# Patient Record
Sex: Male | Born: 1965
Health system: Southern US, Community
[De-identification: ages and names within clinical notes are randomized; demographics above are authoritative.]

## PROBLEM LIST (undated history)

## (undated) DIAGNOSIS — E785 Hyperlipidemia, unspecified: Secondary | ICD-10-CM

---

## 2005-11-06 ENCOUNTER — Ambulatory Visit (HOSPITAL_COMMUNITY): Admission: RE | Admit: 2005-11-06 | Discharge: 2005-11-06 | Payer: Self-pay | Admitting: Orthopedic Surgery

## 2011-03-17 ENCOUNTER — Emergency Department (HOSPITAL_COMMUNITY): Payer: Commercial Managed Care - PPO

## 2011-03-17 ENCOUNTER — Other Ambulatory Visit: Payer: Self-pay

## 2011-03-17 ENCOUNTER — Emergency Department (HOSPITAL_COMMUNITY)
Admission: EM | Admit: 2011-03-17 | Discharge: 2011-03-17 | Disposition: A | Discharge status: Home / Self Care | Payer: Commercial Managed Care - PPO | Attending: Emergency Medicine | Admitting: Emergency Medicine

## 2011-03-17 ENCOUNTER — Encounter (HOSPITAL_COMMUNITY): Payer: Self-pay | Admitting: *Deleted

## 2011-03-17 DIAGNOSIS — E7800 Pure hypercholesterolemia, unspecified: Secondary | ICD-10-CM | POA: Insufficient documentation

## 2011-03-17 DIAGNOSIS — J40 Bronchitis, not specified as acute or chronic: Secondary | ICD-10-CM | POA: Insufficient documentation

## 2011-03-17 DIAGNOSIS — R079 Chest pain, unspecified: Secondary | ICD-10-CM | POA: Insufficient documentation

## 2011-03-17 DIAGNOSIS — Z8249 Family history of ischemic heart disease and other diseases of the circulatory system: Secondary | ICD-10-CM | POA: Insufficient documentation

## 2011-03-17 DIAGNOSIS — I10 Essential (primary) hypertension: Secondary | ICD-10-CM | POA: Insufficient documentation

## 2011-03-17 DIAGNOSIS — Z79899 Other long term (current) drug therapy: Secondary | ICD-10-CM | POA: Insufficient documentation

## 2011-03-17 DIAGNOSIS — K219 Gastro-esophageal reflux disease without esophagitis: Secondary | ICD-10-CM | POA: Insufficient documentation

## 2011-03-17 LAB — CBC
Hemoglobin: 15.9 g/dL (ref 13.0–17.0)
MCH: 30.3 pg (ref 26.0–34.0)
MCHC: 32.9 g/dL (ref 30.0–36.0)
RDW: 16.3 % — ABNORMAL HIGH (ref 11.5–15.5)

## 2011-03-17 LAB — POCT I-STAT TROPONIN I: Troponin i, poc: 0 ng/mL (ref 0.00–0.08)

## 2011-03-17 LAB — BASIC METABOLIC PANEL
BUN: 11 mg/dL (ref 6–23)
Calcium: 9.7 mg/dL (ref 8.4–10.5)
Creatinine, Ser: 1.07 mg/dL (ref 0.50–1.35)
GFR calc non Af Amer: 82 mL/min — ABNORMAL LOW (ref >90)
Glucose, Bld: 85 mg/dL (ref 70–99)
Potassium: 4 mEq/L (ref 3.5–5.1)

## 2011-03-17 MED ORDER — ALBUTEROL SULFATE (5 MG/ML) 0.5% IN NEBU
5.0000 mg | INHALATION_SOLUTION | Freq: Once | RESPIRATORY_TRACT | Status: AC
  Administered 2011-03-17: 5 mg via RESPIRATORY_TRACT
  Filled 2011-03-17: qty 1

## 2011-03-17 MED ORDER — ALBUTEROL SULFATE HFA 108 (90 BASE) MCG/ACT IN AERS
1.0000 | INHALATION_SPRAY | Freq: Once | RESPIRATORY_TRACT | Status: AC
  Administered 2011-03-17: 1 via RESPIRATORY_TRACT
  Filled 2011-03-17: qty 6.7

## 2011-03-17 MED ORDER — GI COCKTAIL ~~LOC~~
30.0000 mL | Freq: Once | ORAL | Status: AC
  Administered 2011-03-17: 30 mL via ORAL
  Filled 2011-03-17: qty 30

## 2011-03-17 MED ORDER — PREDNISONE 10 MG PO TABS: 10.0000 mg | ORAL_TABLET | Freq: Two times a day (BID) | ORAL | Status: DC

## 2011-03-17 MED ORDER — IPRATROPIUM BROMIDE 0.02 % IN SOLN
0.5000 mg | Freq: Once | RESPIRATORY_TRACT | Status: AC
  Administered 2011-03-17: 0.5 mg via RESPIRATORY_TRACT
  Filled 2011-03-17: qty 2.5

## 2011-03-17 MED ORDER — FAMOTIDINE IN NACL 20-0.9 MG/50ML-% IV SOLN
20.0000 mg | Freq: Once | INTRAVENOUS | Status: AC
  Administered 2011-03-17: 20 mg via INTRAVENOUS
  Filled 2011-03-17: qty 50

## 2011-03-17 MED ORDER — ASPIRIN 81 MG PO CHEW
324.0000 mg | CHEWABLE_TABLET | Freq: Once | ORAL | Status: AC
  Administered 2011-03-17: 324 mg via ORAL
  Filled 2011-03-17: qty 4

## 2011-03-17 NOTE — ED Notes (Signed)
RT called for breathing tx. 

## 2011-03-17 NOTE — ED Notes (Signed)
PT does not know stress test result and appears anxious; reporting sign cardiac hx with parents.

## 2011-03-17 NOTE — Discharge Instructions (Signed)
Use albuterol inhaler, 2 puffs every 4 hours, as needed for cough and shortness of breath.  Take prednisone as prescribed.   Follow up with your family doctor.  You should return to the ER if your chest pain or shortness of breath worsens. OlC

## 2011-03-17 NOTE — ED Notes (Signed)
Patient c/o chest tightness onset last pm states he took new medication last pm c/o slight sob. States he had a stress test earlier in the week.

## 2011-03-17 NOTE — ED Provider Notes (Signed)
History     CSN: [id number]  Arrival date & time 03/17/11  4010   First MD Initiated Contact with Patient 03/17/11 0602      Chief Complaint  Patient presents with  . Chest Pain    (Consider location/radiation/quality/duration/timing/severity/associated sxs/prior treatment) HPI History provided by pt.   Pt has had constant, non-radiating, substernal chest tightness since midnight last night.  Associated w/ chest tingling and SOB.  Non-exertional.  Has had a mild cough recently as well as chills. No recent trauma/heavy lifting.  H/o acid reflux but this pain feels different.  Has been taking omeprazole for the past few days.  H/o HTN and hypercholesterolemia.  Strong FH MI.  Had a stress test at [facility] 3 days ago and is unsure of results.  No RF for PE and denies LE pain/edema.   History reviewed. No pertinent past medical history.  History reviewed. No pertinent past surgical history.  History reviewed. No pertinent family history.  History  Substance Use Topics  . Smoking status: Never Smoker   . Smokeless tobacco: Not on file  . Alcohol Use: No      Review of Systems  All other systems reviewed and are negative.    Allergies  Codeine  Home Medications   Current Outpatient Rx  Name Route Sig Dispense Refill  . ATORVASTATIN CALCIUM 40 MG PO TABS Oral Take 40 mg by mouth daily.    Marland Kitchen EZETIMIBE 10 MG PO TABS Oral Take 10 mg by mouth daily.    Marland Kitchen OMEPRAZOLE 40 MG PO CPDR Oral Take 40 mg by mouth daily.      BP 154/83  Pulse 71  Temp(Src) 97.6 F (36.4 C) (Oral)  Resp 18  SpO2 97%  Physical Exam  Nursing note and vitals reviewed. Constitutional: He is oriented to person, place, and time. He appears well-developed and well-nourished. No distress.  HENT:  Head: Normocephalic and atraumatic.  Eyes:       Normal appearance  Neck: Normal range of motion.  Cardiovascular: Normal rate, regular rhythm and intact distal pulses.   Pulmonary/Chest:  Effort normal and breath sounds normal. No respiratory distress. He exhibits no tenderness.       Pt asks to have head of bed elevated because he thinks laying flat is aggravating his pain.   Abdominal: Soft. Bowel sounds are normal. He exhibits no distension. There is no tenderness. There is no guarding.  Musculoskeletal:       No peripheral edema or calf tenderness  Neurological: He is alert and oriented to person, place, and time.  Skin: Skin is warm and dry. No rash noted.  Psychiatric: He has a normal mood and affect. His behavior is normal.       Anxious appearing    ED Course  Procedures (including critical care time)   Date: 03/17/2011  Rate: 68  Rhythm: normal sinus rhythm  QRS Axis: normal  Intervals: normal  ST/T Wave abnormalities: normal  Conduction Disutrbances: none  Narrative Interpretation:   Old EKG Reviewed: none  Labs Reviewed  BASIC METABOLIC PANEL - Abnormal; Notable for the following:    GFR calc non Af Amer 82 (*)    All other components within normal limits  CBC - Abnormal; Notable for the following:    RDW 16.3 (*)    All other components within normal limits  POCT I-STAT TROPONIN I   Dg Chest 2 View  03/17/2011  *RADIOLOGY REPORT*  Clinical Data: Chest tightness  CHEST -  2 VIEW  Comparison: 09/23/2007  Findings: Normal heart size and pulmonary vascularity for technique.  Peribronchial thickening and perihilar interstitial changes suggesting chronic bronchitis versus reactive airways disease.  Mild hyperinflation. No focal airspace consolidation in the lungs.  No blunting of costophrenic angles.  No pneumothorax. No significant change since previous study.  IMPRESSION: Hyperinflation with peribronchial thickening suggesting chronic bronchitis or reactive airways disease.  No focal consolidation.  Original Report Authenticated By: Marlon Pel, M.D.     1. Bronchitis       MDM  46yo M w/ h/o HTN and hyperlipidemia and strong FH of MI presents  w/ atypical CP.  Had a stress test at [facility] on Tuesday.  Results reviewed and are normal.  No acute findings on exam.  EKG non-ischemic.  Suspect acid reflux.  Aspirin as well as GI cocktail and IV pepcid ordered.  CXR and troponin pending.  6:20 AM   CXR shows bronchitis.  Troponin neg.  Pt reports mild relief w/ GI cocktail.  Will try a breathing treatment and reassess shortly.  7:40 AM   Pt reports that his tightness has improved.  Lung sounds clear and no coughing on re-examination.  D/c'd home w/ an albuterol inhaler and prescription for prednisone.  Advised him to f/u with PCP and return precautions discussed.       Otilio Miu, [place] 03/17/11 [phone]

## 2011-03-18 NOTE — ED Provider Notes (Signed)
Medical screening examination/treatment/procedure(s) were performed by non-physician practitioner and as supervising physician I was immediately available for consultation/collaboration.   Joya Gaskins, MD 03/18/11 [phone]

## 2015-01-31 HISTORY — PX: VENTRAL HERNIA REPAIR: SHX424

## 2017-03-02 DIAGNOSIS — E1165 Type 2 diabetes mellitus with hyperglycemia: Secondary | ICD-10-CM | POA: Diagnosis not present

## 2017-03-16 DIAGNOSIS — E1165 Type 2 diabetes mellitus with hyperglycemia: Secondary | ICD-10-CM | POA: Diagnosis not present

## 2017-03-16 DIAGNOSIS — E291 Testicular hypofunction: Secondary | ICD-10-CM | POA: Diagnosis not present

## 2017-03-16 DIAGNOSIS — E039 Hypothyroidism, unspecified: Secondary | ICD-10-CM | POA: Diagnosis not present

## 2017-03-27 DIAGNOSIS — E039 Hypothyroidism, unspecified: Secondary | ICD-10-CM | POA: Diagnosis not present

## 2017-03-30 DIAGNOSIS — J0120 Acute ethmoidal sinusitis, unspecified: Secondary | ICD-10-CM | POA: Diagnosis not present

## 2017-05-14 DIAGNOSIS — N419 Inflammatory disease of prostate, unspecified: Secondary | ICD-10-CM | POA: Diagnosis not present

## 2017-05-15 DIAGNOSIS — T887XXA Unspecified adverse effect of drug or medicament, initial encounter: Secondary | ICD-10-CM | POA: Diagnosis not present

## 2017-06-21 DIAGNOSIS — R300 Dysuria: Secondary | ICD-10-CM | POA: Diagnosis not present

## 2017-09-03 DIAGNOSIS — J329 Chronic sinusitis, unspecified: Secondary | ICD-10-CM | POA: Diagnosis not present

## 2017-09-03 DIAGNOSIS — J40 Bronchitis, not specified as acute or chronic: Secondary | ICD-10-CM | POA: Diagnosis not present

## 2017-12-24 DIAGNOSIS — E1165 Type 2 diabetes mellitus with hyperglycemia: Secondary | ICD-10-CM | POA: Diagnosis not present

## 2017-12-24 DIAGNOSIS — Z79899 Other long term (current) drug therapy: Secondary | ICD-10-CM | POA: Diagnosis not present

## 2017-12-24 DIAGNOSIS — Z6834 Body mass index (BMI) 34.0-34.9, adult: Secondary | ICD-10-CM | POA: Diagnosis not present

## 2018-02-06 DIAGNOSIS — E785 Hyperlipidemia, unspecified: Secondary | ICD-10-CM | POA: Diagnosis not present

## 2018-02-06 DIAGNOSIS — G629 Polyneuropathy, unspecified: Secondary | ICD-10-CM | POA: Diagnosis not present

## 2018-02-06 DIAGNOSIS — E1169 Type 2 diabetes mellitus with other specified complication: Secondary | ICD-10-CM | POA: Diagnosis not present

## 2018-05-13 DIAGNOSIS — E785 Hyperlipidemia, unspecified: Secondary | ICD-10-CM | POA: Diagnosis not present

## 2018-05-13 DIAGNOSIS — E782 Mixed hyperlipidemia: Secondary | ICD-10-CM | POA: Diagnosis not present

## 2018-05-13 DIAGNOSIS — E039 Hypothyroidism, unspecified: Secondary | ICD-10-CM | POA: Diagnosis not present

## 2018-05-13 DIAGNOSIS — E1169 Type 2 diabetes mellitus with other specified complication: Secondary | ICD-10-CM | POA: Diagnosis not present

## 2019-09-01 ENCOUNTER — Encounter (HOSPITAL_COMMUNITY): Payer: Self-pay | Admitting: Emergency Medicine

## 2019-09-01 ENCOUNTER — Other Ambulatory Visit: Payer: Self-pay

## 2019-09-01 ENCOUNTER — Inpatient Hospital Stay (HOSPITAL_COMMUNITY)
Admission: EM | Admit: 2019-09-01 | Discharge: 2019-09-16 | DRG: 177 | Disposition: A | Discharge status: Home / Self Care | Payer: Commercial Managed Care - PPO | Attending: Internal Medicine | Admitting: Internal Medicine

## 2019-09-01 ENCOUNTER — Emergency Department (HOSPITAL_COMMUNITY): Payer: Commercial Managed Care - PPO

## 2019-09-01 DIAGNOSIS — Z7952 Long term (current) use of systemic steroids: Secondary | ICD-10-CM

## 2019-09-01 DIAGNOSIS — K219 Gastro-esophageal reflux disease without esophagitis: Secondary | ICD-10-CM | POA: Diagnosis present

## 2019-09-01 DIAGNOSIS — J9601 Acute respiratory failure with hypoxia: Secondary | ICD-10-CM | POA: Diagnosis present

## 2019-09-01 DIAGNOSIS — Z885 Allergy status to narcotic agent status: Secondary | ICD-10-CM

## 2019-09-01 DIAGNOSIS — R609 Edema, unspecified: Secondary | ICD-10-CM | POA: Diagnosis not present

## 2019-09-01 DIAGNOSIS — I2510 Atherosclerotic heart disease of native coronary artery without angina pectoris: Secondary | ICD-10-CM | POA: Diagnosis present

## 2019-09-01 DIAGNOSIS — E118 Type 2 diabetes mellitus with unspecified complications: Secondary | ICD-10-CM | POA: Diagnosis present

## 2019-09-01 DIAGNOSIS — N179 Acute kidney failure, unspecified: Secondary | ICD-10-CM | POA: Diagnosis present

## 2019-09-01 DIAGNOSIS — U071 COVID-19: Secondary | ICD-10-CM | POA: Diagnosis present

## 2019-09-01 DIAGNOSIS — J189 Pneumonia, unspecified organism: Secondary | ICD-10-CM | POA: Diagnosis present

## 2019-09-01 DIAGNOSIS — E785 Hyperlipidemia, unspecified: Secondary | ICD-10-CM | POA: Diagnosis present

## 2019-09-01 DIAGNOSIS — E7800 Pure hypercholesterolemia, unspecified: Secondary | ICD-10-CM | POA: Diagnosis not present

## 2019-09-01 DIAGNOSIS — R7989 Other specified abnormal findings of blood chemistry: Secondary | ICD-10-CM | POA: Diagnosis not present

## 2019-09-01 DIAGNOSIS — B1910 Unspecified viral hepatitis B without hepatic coma: Secondary | ICD-10-CM | POA: Diagnosis present

## 2019-09-01 DIAGNOSIS — Z79899 Other long term (current) drug therapy: Secondary | ICD-10-CM

## 2019-09-01 DIAGNOSIS — E1165 Type 2 diabetes mellitus with hyperglycemia: Secondary | ICD-10-CM | POA: Diagnosis not present

## 2019-09-01 DIAGNOSIS — Z20822 Contact with and (suspected) exposure to covid-19: Secondary | ICD-10-CM | POA: Diagnosis present

## 2019-09-01 DIAGNOSIS — E039 Hypothyroidism, unspecified: Secondary | ICD-10-CM | POA: Diagnosis present

## 2019-09-01 DIAGNOSIS — J1282 Pneumonia due to coronavirus disease 2019: Secondary | ICD-10-CM | POA: Diagnosis present

## 2019-09-01 DIAGNOSIS — R0602 Shortness of breath: Secondary | ICD-10-CM | POA: Diagnosis not present

## 2019-09-01 HISTORY — DX: Hyperlipidemia, unspecified: E78.5

## 2019-09-01 LAB — PROCALCITONIN: Procalcitonin: 0.55 ng/mL

## 2019-09-01 LAB — COMPREHENSIVE METABOLIC PANEL
ALT: 78 U/L — ABNORMAL HIGH (ref 0–44)
AST: 120 U/L — ABNORMAL HIGH (ref 15–41)
Albumin: 3.5 g/dL (ref 3.5–5.0)
Alkaline Phosphatase: 74 U/L (ref 38–126)
Anion gap: 15 (ref 5–15)
BUN: 15 mg/dL (ref 6–20)
CO2: 24 mmol/L (ref 22–32)
Calcium: 8.9 mg/dL (ref 8.9–10.3)
Chloride: 98 mmol/L (ref 98–111)
Creatinine, Ser: 1.28 mg/dL — ABNORMAL HIGH (ref 0.61–1.24)
GFR calc Af Amer: >60 mL/min (ref >60)
GFR calc non Af Amer: >60 mL/min (ref >60)
Glucose, Bld: 230 mg/dL — ABNORMAL HIGH (ref 70–99)
Potassium: 3.8 mmol/L (ref 3.5–5.1)
Sodium: 137 mmol/L (ref 135–145)
Total Bilirubin: 1 mg/dL (ref 0.3–1.2)
Total Protein: 7.9 g/dL (ref 6.5–8.1)

## 2019-09-01 LAB — CBC WITH DIFFERENTIAL/PLATELET
Abs Immature Granulocytes: 0.03 10*3/uL (ref 0.00–0.07)
Basophils Absolute: 0 10*3/uL (ref 0.0–0.1)
Basophils Relative: 0 %
Eosinophils Absolute: 0 10*3/uL (ref 0.0–0.5)
Eosinophils Relative: 0 %
HCT: 57.1 % — ABNORMAL HIGH (ref 39.0–52.0)
Hemoglobin: 18.5 g/dL — ABNORMAL HIGH (ref 13.0–17.0)
Immature Granulocytes: 1 %
Lymphocytes Relative: 12 %
Lymphs Abs: 0.7 10*3/uL (ref 0.7–4.0)
MCH: 28.6 pg (ref 26.0–34.0)
MCHC: 32.4 g/dL (ref 30.0–36.0)
MCV: 88.4 fL (ref 80.0–100.0)
Monocytes Absolute: 0.4 10*3/uL (ref 0.1–1.0)
Monocytes Relative: 6 %
Neutro Abs: 4.6 10*3/uL (ref 1.7–7.7)
Neutrophils Relative %: 81 %
Platelets: 157 10*3/uL (ref 150–400)
RBC: 6.46 MIL/uL — ABNORMAL HIGH (ref 4.22–5.81)
RDW: 14.1 % (ref 11.5–15.5)
WBC: 5.7 10*3/uL (ref 4.0–10.5)
nRBC: 0 % (ref 0.0–0.2)

## 2019-09-01 LAB — URINALYSIS, ROUTINE W REFLEX MICROSCOPIC
Bacteria, UA: NONE SEEN
Bilirubin Urine: NEGATIVE
Glucose, UA: >=500 mg/dL — AB
Ketones, ur: 20 mg/dL — AB
Leukocytes,Ua: NEGATIVE
Nitrite: NEGATIVE
Protein, ur: >=300 mg/dL — AB
Specific Gravity, Urine: 1.039 — ABNORMAL HIGH (ref 1.005–1.030)
pH: 6 (ref 5.0–8.0)

## 2019-09-01 LAB — LACTATE DEHYDROGENASE: LDH: 528 U/L — ABNORMAL HIGH (ref 98–192)

## 2019-09-01 LAB — D-DIMER, QUANTITATIVE: D-Dimer, Quant: 1.73 ug/mL-FEU — ABNORMAL HIGH (ref 0.00–0.50)

## 2019-09-01 LAB — LACTIC ACID, PLASMA: Lactic Acid, Venous: 2.6 mmol/L (ref 0.5–1.9)

## 2019-09-01 LAB — TRIGLYCERIDES: Triglycerides: 198 mg/dL — ABNORMAL HIGH (ref <150)

## 2019-09-01 LAB — C-REACTIVE PROTEIN: CRP: 5.4 mg/dL — ABNORMAL HIGH (ref <1.0)

## 2019-09-01 LAB — FIBRINOGEN: Fibrinogen: 644 mg/dL — ABNORMAL HIGH (ref 210–475)

## 2019-09-01 LAB — FERRITIN: Ferritin: 503 ng/mL — ABNORMAL HIGH (ref 24–336)

## 2019-09-01 MED ORDER — ACETAMINOPHEN 325 MG PO TABS
650.0000 mg | ORAL_TABLET | Freq: Four times a day (QID) | ORAL | Status: DC | PRN
  Administered 2019-09-04 – 2019-09-09 (×6): 650 mg via ORAL
  Filled 2019-09-01 (×6): qty 2

## 2019-09-01 MED ORDER — GUAIFENESIN-DM 100-10 MG/5ML PO SYRP
10.0000 mL | ORAL_SOLUTION | ORAL | Status: DC | PRN
  Administered 2019-09-02 – 2019-09-15 (×17): 10 mL via ORAL
  Filled 2019-09-01 (×19): qty 10

## 2019-09-01 MED ORDER — DEXAMETHASONE 4 MG PO TABS
6.0000 mg | ORAL_TABLET | Freq: Every day | ORAL | Status: DC
  Administered 2019-09-01 – 2019-09-02 (×2): 6 mg via ORAL
  Filled 2019-09-01 (×2): qty 2

## 2019-09-01 MED ORDER — SODIUM CHLORIDE 0.9 % IV SOLN
1.0000 g | Freq: Once | INTRAVENOUS | Status: AC
  Administered 2019-09-01: 1 g via INTRAVENOUS
  Filled 2019-09-01: qty 10

## 2019-09-01 MED ORDER — SODIUM CHLORIDE 0.9 % IV SOLN
100.0000 mg | Freq: Every day | INTRAVENOUS | Status: AC
  Administered 2019-09-03 – 2019-09-06 (×4): 100 mg via INTRAVENOUS
  Filled 2019-09-01 (×4): qty 20

## 2019-09-01 MED ORDER — ACETAMINOPHEN 325 MG PO TABS
650.0000 mg | ORAL_TABLET | Freq: Once | ORAL | Status: AC | PRN
  Administered 2019-09-01: 650 mg via ORAL
  Filled 2019-09-01: qty 2

## 2019-09-01 MED ORDER — ALBUTEROL SULFATE HFA 108 (90 BASE) MCG/ACT IN AERS
2.0000 | INHALATION_SPRAY | RESPIRATORY_TRACT | Status: DC | PRN
  Administered 2019-09-01: 2 via RESPIRATORY_TRACT
  Filled 2019-09-01: qty 6.7

## 2019-09-01 MED ORDER — ONDANSETRON HCL 4 MG PO TABS: 4.0000 mg | ORAL_TABLET | Freq: Four times a day (QID) | ORAL | Status: DC | PRN

## 2019-09-01 MED ORDER — AEROCHAMBER PLUS FLO-VU LARGE MISC: 1.0000 | Freq: Once | Status: DC

## 2019-09-01 MED ORDER — SODIUM CHLORIDE 0.9 % IV SOLN
200.0000 mg | Freq: Once | INTRAVENOUS | Status: AC
  Administered 2019-09-02: 200 mg via INTRAVENOUS
  Filled 2019-09-01: qty 40

## 2019-09-01 MED ORDER — SODIUM CHLORIDE 0.9% FLUSH: 3.0000 mL | Freq: Once | INTRAVENOUS | Status: DC

## 2019-09-01 MED ORDER — ALBUTEROL SULFATE HFA 108 (90 BASE) MCG/ACT IN AERS
2.0000 | INHALATION_SPRAY | Freq: Four times a day (QID) | RESPIRATORY_TRACT | Status: DC
  Administered 2019-09-02 – 2019-09-05 (×14): 2 via RESPIRATORY_TRACT
  Filled 2019-09-01: qty 6.7

## 2019-09-01 MED ORDER — SODIUM CHLORIDE 0.9 % IV SOLN
500.0000 mg | Freq: Once | INTRAVENOUS | Status: AC
  Administered 2019-09-01: 500 mg via INTRAVENOUS
  Filled 2019-09-01: qty 500

## 2019-09-01 MED ORDER — HYDROCOD POLST-CPM POLST ER 10-8 MG/5ML PO SUER
5.0000 mL | Freq: Two times a day (BID) | ORAL | Status: DC | PRN
  Administered 2019-09-02 – 2019-09-16 (×15): 5 mL via ORAL
  Filled 2019-09-01 (×16): qty 5

## 2019-09-01 MED ORDER — SODIUM CHLORIDE 0.9 % IV SOLN
1000.0000 mL | INTRAVENOUS | Status: DC
  Administered 2019-09-01 – 2019-09-02 (×2): 1000 mL via INTRAVENOUS

## 2019-09-01 MED ORDER — IBUPROFEN 800 MG PO TABS
800.0000 mg | ORAL_TABLET | Freq: Once | ORAL | Status: AC
  Administered 2019-09-01: 800 mg via ORAL
  Filled 2019-09-01: qty 1

## 2019-09-01 MED ORDER — DEXAMETHASONE SODIUM PHOSPHATE 10 MG/ML IJ SOLN
10.0000 mg | Freq: Once | INTRAMUSCULAR | Status: AC
  Administered 2019-09-01: 10 mg via INTRAVENOUS
  Filled 2019-09-01: qty 1

## 2019-09-01 MED ORDER — ENOXAPARIN SODIUM 40 MG/0.4ML ~~LOC~~ SOLN
40.0000 mg | Freq: Every day | SUBCUTANEOUS | Status: DC
  Administered 2019-09-02 – 2019-09-10 (×9): 40 mg via SUBCUTANEOUS
  Filled 2019-09-01 (×9): qty 0.4

## 2019-09-01 MED ORDER — ONDANSETRON HCL 4 MG/2ML IJ SOLN: 4.0000 mg | Freq: Four times a day (QID) | INTRAMUSCULAR | Status: DC | PRN

## 2019-09-01 NOTE — ED Provider Notes (Signed)
[facility] EMERGENCY DEPARTMENT Provider Note   CSN: [id number] Arrival date & time: 09/01/19  1547     History Chief Complaint  Patient presents with  . COVID+  . Shortness of Breath    Thomas Silva is a 54 y.o. male.  Pt presents to the ED today with sob and cough.  Pt said sx have been going on for a few days.  He went to his pcp's office today and was diagnosed with Covid.  No known Covid contacts, but he has not been vaccinated.  Pt said his pcp told him to come to the ED.  Pt had a fever in triage, so was given tylenol.  Pt continues to feel sob.        History reviewed. No pertinent past medical history.  There are no problems to display for this patient.   History reviewed. No pertinent surgical history.     History reviewed. No pertinent family history.  Social History   Tobacco Use  . Smoking status: Never Smoker  Substance Use Topics  . Alcohol use: No  . Drug use: No    Home Medications Prior to Admission medications   Medication Sig Start Date End Date Taking? Authorizing Provider  atorvastatin (LIPITOR) 40 MG tablet Take 40 mg by mouth daily.    [provider]  ezetimibe (ZETIA) 10 MG tablet Take 10 mg by mouth daily.    [provider]  omeprazole (PRILOSEC) 40 MG capsule Take 40 mg by mouth daily.    [provider]  predniSONE (DELTASONE) 10 MG tablet Take 1 tablet (10 mg total) by mouth 2 (two) times daily. 03/17/11   Schinlever, Santina Evans, PA-C    Allergies    Codeine  Review of Systems   Review of Systems  Constitutional: Positive for fever.  Respiratory: Positive for cough and shortness of breath.   All other systems reviewed and are negative.   Physical Exam Updated Vital Signs BP 122/70 (BP Location: Left Arm)   Pulse (!) 117   Temp 100.2 F (37.9 C) (Oral)   Resp 20   Ht 5\' 11"  (1.803 m)   Wt 108.9 kg   SpO2 94%   BMI 33.47 kg/m   Physical Exam Vitals and nursing note  reviewed.  Constitutional:      Appearance: He is well-developed.  HENT:     Head: Normocephalic and atraumatic.     Mouth/Throat:     Mouth: Mucous membranes are moist.     Pharynx: Oropharynx is clear.  Eyes:     Extraocular Movements: Extraocular movements intact.     Pupils: Pupils are equal, round, and reactive to light.  Cardiovascular:     Rate and Rhythm: Regular rhythm. Tachycardia present.  Pulmonary:     Effort: Tachypnea present.     Breath sounds: Wheezing present.  Abdominal:     General: Bowel sounds are normal.     Palpations: Abdomen is soft.  Musculoskeletal:        General: Normal range of motion.     Cervical back: Normal range of motion and neck supple.  Skin:    General: Skin is warm.     Capillary Refill: Capillary refill takes less than 2 seconds.  Neurological:     General: No focal deficit present.     Mental Status: He is alert and oriented to person, place, and time.  Psychiatric:        Mood and Affect: Mood normal.  Behavior: Behavior normal.     ED Results / Procedures / Treatments   Labs (all labs ordered are listed, but only abnormal results are displayed) Labs Reviewed  LACTIC ACID, PLASMA - Abnormal; Notable for the following components:      Result Value   Lactic Acid, Venous 2.6 (*)    All other components within normal limits  COMPREHENSIVE METABOLIC PANEL - Abnormal; Notable for the following components:   Glucose, Bld 230 (*)    Creatinine, Ser 1.28 (*)    AST 120 (*)    ALT 78 (*)    All other components within normal limits  CBC WITH DIFFERENTIAL/PLATELET - Abnormal; Notable for the following components:   RBC 6.46 (*)    Hemoglobin 18.5 (*)    HCT 57.1 (*)    All other components within normal limits  URINALYSIS, ROUTINE W REFLEX MICROSCOPIC - Abnormal; Notable for the following components:   Specific Gravity, Urine 1.039 (*)    Glucose, UA >=500 (*)    Hgb urine dipstick MODERATE (*)    Ketones, ur 20 (*)     Protein, ur >=300 (*)    All other components within normal limits  D-DIMER, QUANTITATIVE (NOT AT [facility]) - Abnormal; Notable for the following components:   D-Dimer, Quant 1.73 (*)    All other components within normal limits  LACTATE DEHYDROGENASE - Abnormal; Notable for the following components:   LDH 528 (*)    All other components within normal limits  FERRITIN - Abnormal; Notable for the following components:   Ferritin 503 (*)    All other components within normal limits  TRIGLYCERIDES - Abnormal; Notable for the following components:   Triglycerides 198 (*)    All other components within normal limits  FIBRINOGEN - Abnormal; Notable for the following components:   Fibrinogen 644 (*)    All other components within normal limits  C-REACTIVE PROTEIN - Abnormal; Notable for the following components:   CRP 5.4 (*)    All other components within normal limits  CULTURE, BLOOD (ROUTINE X 2)  CULTURE, BLOOD (ROUTINE X 2)  SARS CORONAVIRUS 2 BY RT PCR (HOSPITAL ORDER, PERFORMED IN [facility])  LACTIC ACID, PLASMA  PROCALCITONIN    EKG EKG Interpretation  Date/Time:  Monday September 01 2019 17:31:05 EDT Ventricular Rate:  119 PR Interval:  136 QRS Duration: 86 QT Interval:  296 QTC Calculation: 416 R Axis:   -47 Text Interpretation: Sinus tachycardia Left anterior fascicular block Cannot rule out Inferior infarct (masked by fascicular block?) , age undetermined Possible Anterolateral infarct , age undetermined Abnormal ECG Since last tracing rate faster Confirmed by Jacalyn Lefevre [phone]) on 09/01/2019 11:30:58 PM   Radiology DG Chest Portable 1 View  Result Date: 09/01/2019 CLINICAL DATA:  54 year old male with positive COVID-19 EXAM: PORTABLE CHEST 1 VIEW COMPARISON:  Chest radiograph dated 05/07 FINDINGS: Bilateral streaky densities most consistent with infiltrate, likely viral or atypical in etiology and in keeping with COVID-19. Clinical correlation is  recommended. No pleural effusion or pneumothorax. The cardiac silhouette is within limits. No acute osseous pathology. IMPRESSION: Bilateral streaky densities most consistent with COVID-19 infiltrate. Electronically Signed   By: Elgie Collard M.D.   On: 09/01/2019 18:12    Procedures Procedures (including critical care time)  Medications Ordered in ED Medications  sodium chloride flush (NS) 0.9 % injection 3 mL (has no administration in time range)  0.9 %  sodium chloride infusion (1,000 mLs Intravenous New Bag/Given 09/01/19 2236)  albuterol (VENTOLIN HFA) 108 (90 Base) MCG/ACT inhaler 2 puff (2 puffs Inhalation Given 09/01/19 2237)  AeroChamber Plus Flo-Vu Large MISC 1 each (has no administration in time range)  azithromycin (ZITHROMAX) 500 mg in sodium chloride 0.9 % 250 mL IVPB (500 mg Intravenous New Bag/Given 09/01/19 2331)  acetaminophen (TYLENOL) tablet 650 mg (650 mg Oral Given 09/01/19 1751)  dexamethasone (DECADRON) injection 10 mg (10 mg Intravenous Given 09/01/19 2236)  cefTRIAXone (ROCEPHIN) 1 g in sodium chloride 0.9 % 100 mL IVPB (0 g Intravenous Stopped 09/01/19 2306)  ibuprofen (ADVIL) tablet 800 mg (800 mg Oral Given 09/01/19 2236)    ED Course  I have reviewed the triage vital signs and the nursing notes.  Pertinent labs & imaging results that were available during my care of the patient were reviewed by me and considered in my medical decision making (see chart for details).    MDM Rules/Calculators/A&P                          O2 sat 90 on RA.  He was put on 2L oxygen via [place].  Pt is still very SOB.  I stood him up and helped him to use the urinal.  He was taken off oxygen to see how he did.  His HR went up to 120s and O2 sat dropped to 87%.  Pt put back on oxygen at 4L.    Pt d/w Dr. Julian Reil (triad) for admission.  CRITICAL CARE Performed by: Jacalyn Lefevre   Total critical care time: 30 minutes  Critical care time was exclusive of separately billable procedures and  treating other patients.  Critical care was necessary to treat or prevent imminent or life-threatening deterioration.  Critical care was time spent personally by me on the following activities: development of treatment plan with patient and/or surrogate as well as nursing, discussions with consultants, evaluation of patient's response to treatment, examination of patient, obtaining history from patient or surrogate, ordering and performing treatments and interventions, ordering and review of laboratory studies, ordering and review of radiographic studies, pulse oximetry and re-evaluation of patient's condition.   Thomas Silva was evaluated in Emergency Department on 09/01/2019 for the symptoms described in the history of present illness. He was evaluated in the context of the global COVID-19 pandemic, which necessitated consideration that the patient might be at risk for infection with the SARS-CoV-2 virus that causes COVID-19. Institutional protocols and algorithms that pertain to the evaluation of patients at risk for COVID-19 are in a state of rapid change based on information released by regulatory bodies including the CDC and federal and state organizations. These policies and algorithms were followed during the patient's care in the ED.  Final Clinical Impression(s) / ED Diagnoses Final diagnoses:  Pneumonia due to COVID-19 virus  Acute respiratory failure with hypoxia [facility])    Rx / DC Orders ED Discharge Orders    None       Jacalyn Lefevre, MD 09/01/19 2332

## 2019-09-01 NOTE — ED Triage Notes (Signed)
Patient comes to ED with complaints of being COVID positive with shortness of breath. Per patient he has has fevers, body aches, and loss of taste/smell over this week and swabbed positive today. Pt states SOB started yesterday and worsened today. Pt 90% on room air and 94% on 2L.

## 2019-09-01 NOTE — ED Notes (Signed)
Tech 1st placed Pt on O2 after getting Vitals. Checked Pt's Pulse Ox while getting Triaged. Pt dropped down to 90% on RM air. Put Pt back on 2L Nasal O2.

## 2019-09-02 ENCOUNTER — Encounter (HOSPITAL_COMMUNITY): Payer: Self-pay | Admitting: Internal Medicine

## 2019-09-02 DIAGNOSIS — U071 COVID-19: Secondary | ICD-10-CM | POA: Diagnosis not present

## 2019-09-02 DIAGNOSIS — J1282 Pneumonia due to coronavirus disease 2019: Secondary | ICD-10-CM

## 2019-09-02 DIAGNOSIS — J9601 Acute respiratory failure with hypoxia: Secondary | ICD-10-CM

## 2019-09-02 LAB — CBC WITH DIFFERENTIAL/PLATELET
Abs Immature Granulocytes: 0.02 10*3/uL (ref 0.00–0.07)
Basophils Absolute: 0 10*3/uL (ref 0.0–0.1)
Basophils Relative: 0 %
Eosinophils Absolute: 0 10*3/uL (ref 0.0–0.5)
Eosinophils Relative: 0 %
HCT: 49.9 % (ref 39.0–52.0)
Hemoglobin: 15.5 g/dL (ref 13.0–17.0)
Immature Granulocytes: 1 %
Lymphocytes Relative: 9 %
Lymphs Abs: 0.4 10*3/uL — ABNORMAL LOW (ref 0.7–4.0)
MCH: 28.4 pg (ref 26.0–34.0)
MCHC: 31.1 g/dL (ref 30.0–36.0)
MCV: 91.4 fL (ref 80.0–100.0)
Monocytes Absolute: 0.2 10*3/uL (ref 0.1–1.0)
Monocytes Relative: 4 %
Neutro Abs: 3.8 10*3/uL (ref 1.7–7.7)
Neutrophils Relative %: 86 %
Platelets: 127 10*3/uL — ABNORMAL LOW (ref 150–400)
RBC: 5.46 MIL/uL (ref 4.22–5.81)
RDW: 14.2 % (ref 11.5–15.5)
WBC: 4.4 10*3/uL (ref 4.0–10.5)
nRBC: 0 % (ref 0.0–0.2)

## 2019-09-02 LAB — COMPREHENSIVE METABOLIC PANEL
ALT: 63 U/L — ABNORMAL HIGH (ref 0–44)
AST: 117 U/L — ABNORMAL HIGH (ref 15–41)
Albumin: 2.8 g/dL — ABNORMAL LOW (ref 3.5–5.0)
Alkaline Phosphatase: 57 U/L (ref 38–126)
Anion gap: 18 — ABNORMAL HIGH (ref 5–15)
BUN: 24 mg/dL — ABNORMAL HIGH (ref 6–20)
CO2: 19 mmol/L — ABNORMAL LOW (ref 22–32)
Calcium: 7.8 mg/dL — ABNORMAL LOW (ref 8.9–10.3)
Chloride: 99 mmol/L (ref 98–111)
Creatinine, Ser: 1.5 mg/dL — ABNORMAL HIGH (ref 0.61–1.24)
GFR calc Af Amer: >60 mL/min (ref >60)
GFR calc non Af Amer: 52 mL/min — ABNORMAL LOW (ref >60)
Glucose, Bld: 241 mg/dL — ABNORMAL HIGH (ref 70–99)
Potassium: 4.9 mmol/L (ref 3.5–5.1)
Sodium: 136 mmol/L (ref 135–145)
Total Bilirubin: 2.1 mg/dL — ABNORMAL HIGH (ref 0.3–1.2)
Total Protein: 6.1 g/dL — ABNORMAL LOW (ref 6.5–8.1)

## 2019-09-02 LAB — GLUCOSE, CAPILLARY
Glucose-Capillary: 189 mg/dL — ABNORMAL HIGH (ref 70–99)
Glucose-Capillary: 208 mg/dL — ABNORMAL HIGH (ref 70–99)

## 2019-09-02 LAB — D-DIMER, QUANTITATIVE: D-Dimer, Quant: 1.55 ug/mL-FEU — ABNORMAL HIGH (ref 0.00–0.50)

## 2019-09-02 LAB — ABO/RH: ABO/RH(D): A NEG

## 2019-09-02 LAB — HIV ANTIBODY (ROUTINE TESTING W REFLEX): HIV Screen 4th Generation wRfx: NONREACTIVE

## 2019-09-02 LAB — C-REACTIVE PROTEIN: CRP: 5.6 mg/dL — ABNORMAL HIGH (ref <1.0)

## 2019-09-02 LAB — SARS CORONAVIRUS 2 BY RT PCR (HOSPITAL ORDER, PERFORMED IN ~~LOC~~ HOSPITAL LAB): SARS Coronavirus 2: POSITIVE — AB

## 2019-09-02 LAB — LACTIC ACID, PLASMA: Lactic Acid, Venous: 1.7 mmol/L (ref 0.5–1.9)

## 2019-09-02 LAB — PROCALCITONIN: Procalcitonin: 0.59 ng/mL

## 2019-09-02 MED ORDER — SODIUM CHLORIDE 0.9 % IV SOLN: 1000.0000 mL | INTRAVENOUS | Status: DC

## 2019-09-02 MED ORDER — GABAPENTIN 600 MG PO TABS
600.0000 mg | ORAL_TABLET | Freq: Every day | ORAL | Status: DC
  Administered 2019-09-02 – 2019-09-15 (×14): 600 mg via ORAL
  Filled 2019-09-02 (×14): qty 1

## 2019-09-02 MED ORDER — SODIUM CHLORIDE 0.9 % IV SOLN
1.0000 g | INTRAVENOUS | Status: AC
  Administered 2019-09-02 – 2019-09-07 (×6): 1 g via INTRAVENOUS
  Filled 2019-09-02 (×6): qty 10

## 2019-09-02 MED ORDER — SODIUM CHLORIDE 0.9 % IV SOLN
500.0000 mg | INTRAVENOUS | Status: AC
  Administered 2019-09-03 – 2019-09-05 (×4): 500 mg via INTRAVENOUS
  Filled 2019-09-02 (×5): qty 500

## 2019-09-02 MED ORDER — INSULIN ASPART 100 UNIT/ML ~~LOC~~ SOLN
0.0000 [IU] | Freq: Every day | SUBCUTANEOUS | Status: DC
  Administered 2019-09-02 – 2019-09-10 (×7): 2 [IU] via SUBCUTANEOUS
  Administered 2019-09-11: 3 [IU] via SUBCUTANEOUS
  Administered 2019-09-12 – 2019-09-13 (×2): 4 [IU] via SUBCUTANEOUS
  Administered 2019-09-14: 3 [IU] via SUBCUTANEOUS
  Administered 2019-09-15: 2 [IU] via SUBCUTANEOUS

## 2019-09-02 MED ORDER — ENSURE ENLIVE PO LIQD: 237.0000 mL | Freq: Two times a day (BID) | ORAL | Status: DC

## 2019-09-02 MED ORDER — ASPIRIN EC 81 MG PO TBEC
81.0000 mg | DELAYED_RELEASE_TABLET | Freq: Every day | ORAL | Status: DC
  Administered 2019-09-02 – 2019-09-16 (×15): 81 mg via ORAL
  Filled 2019-09-02 (×15): qty 1

## 2019-09-02 MED ORDER — ROSUVASTATIN CALCIUM 5 MG PO TABS
10.0000 mg | ORAL_TABLET | Freq: Every evening | ORAL | Status: DC
  Administered 2019-09-02 – 2019-09-15 (×14): 10 mg via ORAL
  Filled 2019-09-02 (×14): qty 2

## 2019-09-02 MED ORDER — LISINOPRIL 2.5 MG PO TABS
2.5000 mg | ORAL_TABLET | Freq: Every day | ORAL | Status: DC
  Administered 2019-09-02 – 2019-09-16 (×15): 2.5 mg via ORAL
  Filled 2019-09-02 (×16): qty 1

## 2019-09-02 MED ORDER — PANTOPRAZOLE SODIUM 40 MG PO TBEC
40.0000 mg | DELAYED_RELEASE_TABLET | Freq: Every day | ORAL | Status: DC
  Administered 2019-09-02 – 2019-09-16 (×15): 40 mg via ORAL
  Filled 2019-09-02 (×15): qty 1

## 2019-09-02 MED ORDER — GABAPENTIN 300 MG PO CAPS: 300.0000 mg | ORAL_CAPSULE | ORAL | Status: DC

## 2019-09-02 MED ORDER — VALACYCLOVIR HCL 500 MG PO TABS
1000.0000 mg | ORAL_TABLET | Freq: Every day | ORAL | Status: DC
  Administered 2019-09-02 – 2019-09-13 (×12): 1000 mg via ORAL
  Filled 2019-09-02 (×12): qty 2

## 2019-09-02 MED ORDER — FOLIC ACID 1 MG PO TABS
1000.0000 ug | ORAL_TABLET | Freq: Every day | ORAL | Status: DC
  Administered 2019-09-02 – 2019-09-16 (×15): 1 mg via ORAL
  Filled 2019-09-02 (×15): qty 1

## 2019-09-02 MED ORDER — LINAGLIPTIN 5 MG PO TABS
5.0000 mg | ORAL_TABLET | Freq: Every day | ORAL | Status: DC
  Administered 2019-09-02 – 2019-09-16 (×15): 5 mg via ORAL
  Filled 2019-09-02 (×16): qty 1

## 2019-09-02 MED ORDER — DEXAMETHASONE SODIUM PHOSPHATE 10 MG/ML IJ SOLN
10.0000 mg | Freq: Every day | INTRAMUSCULAR | Status: DC
  Administered 2019-09-03 – 2019-09-12 (×10): 10 mg via INTRAVENOUS
  Filled 2019-09-02 (×10): qty 1

## 2019-09-02 MED ORDER — ZOLPIDEM TARTRATE 5 MG PO TABS
5.0000 mg | ORAL_TABLET | Freq: Every evening | ORAL | Status: DC | PRN
  Administered 2019-09-02 – 2019-09-15 (×14): 5 mg via ORAL
  Filled 2019-09-02 (×15): qty 1

## 2019-09-02 MED ORDER — INSULIN ASPART 100 UNIT/ML ~~LOC~~ SOLN
0.0000 [IU] | Freq: Three times a day (TID) | SUBCUTANEOUS | Status: DC
  Administered 2019-09-02: 3 [IU] via SUBCUTANEOUS
  Administered 2019-09-03: 8 [IU] via SUBCUTANEOUS
  Administered 2019-09-03: 3 [IU] via SUBCUTANEOUS
  Administered 2019-09-03: 5 [IU] via SUBCUTANEOUS
  Administered 2019-09-04: 3 [IU] via SUBCUTANEOUS
  Administered 2019-09-04: 11 [IU] via SUBCUTANEOUS
  Administered 2019-09-04 – 2019-09-05 (×2): 5 [IU] via SUBCUTANEOUS
  Administered 2019-09-05: 3 [IU] via SUBCUTANEOUS
  Administered 2019-09-06 – 2019-09-07 (×3): 5 [IU] via SUBCUTANEOUS
  Administered 2019-09-07: 8 [IU] via SUBCUTANEOUS
  Administered 2019-09-08 (×2): 5 [IU] via SUBCUTANEOUS
  Administered 2019-09-09: 3 [IU] via SUBCUTANEOUS
  Administered 2019-09-09 – 2019-09-10 (×2): 11 [IU] via SUBCUTANEOUS
  Administered 2019-09-10: 3 [IU] via SUBCUTANEOUS
  Administered 2019-09-11: 8 [IU] via SUBCUTANEOUS
  Administered 2019-09-11 – 2019-09-12 (×2): 11 [IU] via SUBCUTANEOUS
  Administered 2019-09-12: 5 [IU] via SUBCUTANEOUS
  Administered 2019-09-13: 8 [IU] via SUBCUTANEOUS
  Administered 2019-09-13 (×2): 5 [IU] via SUBCUTANEOUS
  Administered 2019-09-14: 3 [IU] via SUBCUTANEOUS
  Administered 2019-09-14: 2 [IU] via SUBCUTANEOUS
  Administered 2019-09-14 – 2019-09-15 (×3): 3 [IU] via SUBCUTANEOUS
  Administered 2019-09-15: 5 [IU] via SUBCUTANEOUS
  Administered 2019-09-16 (×2): 3 [IU] via SUBCUTANEOUS

## 2019-09-02 MED ORDER — GABAPENTIN 300 MG PO CAPS
300.0000 mg | ORAL_CAPSULE | Freq: Every day | ORAL | Status: DC
  Administered 2019-09-03 – 2019-09-16 (×14): 300 mg via ORAL
  Filled 2019-09-02 (×14): qty 1

## 2019-09-02 MED ORDER — LEVOTHYROXINE SODIUM 25 MCG PO TABS
25.0000 ug | ORAL_TABLET | Freq: Every day | ORAL | Status: DC
  Administered 2019-09-03 – 2019-09-16 (×14): 25 ug via ORAL
  Filled 2019-09-02 (×15): qty 1

## 2019-09-02 MED ORDER — AMITRIPTYLINE HCL 25 MG PO TABS
50.0000 mg | ORAL_TABLET | Freq: Every evening | ORAL | Status: DC | PRN
  Administered 2019-09-05 – 2019-09-06 (×2): 50 mg via ORAL
  Filled 2019-09-02 (×2): qty 2

## 2019-09-02 MED ORDER — INSULIN DETEMIR 100 UNIT/ML ~~LOC~~ SOLN
5.0000 [IU] | Freq: Two times a day (BID) | SUBCUTANEOUS | Status: DC
  Administered 2019-09-02 – 2019-09-03 (×2): 5 [IU] via SUBCUTANEOUS
  Filled 2019-09-02 (×3): qty 0.05

## 2019-09-02 NOTE — Plan of Care (Signed)
  Problem: Education: Goal: Knowledge of risk factors and measures for prevention of condition will improve Outcome: Progressing   Problem: Coping: Goal: Psychosocial and spiritual needs will be supported Outcome: Progressing   Problem: Respiratory: Goal: Will maintain a patent airway Outcome: Progressing Goal: Complications related to the disease process, condition or treatment will be avoided or minimized Outcome: Progressing   Problem: Education: Goal: Knowledge of General Education information will improve Description: Including pain rating scale, medication(s)/side effects and non-pharmacologic comfort measures Outcome: Progressing   Problem: Activity: Goal: Risk for activity intolerance will decrease Outcome: Progressing   Problem: Nutrition: Goal: Adequate nutrition will be maintained Outcome: Progressing

## 2019-09-02 NOTE — ED Notes (Signed)
Pt given incentive spirometer and instructed on use, return demonstration given.  

## 2019-09-02 NOTE — H&P (Signed)
History and Physical    Thomas Silva FYB:[id number] DOB: [birth year] DOA: 09/01/2019  PCP: [name], [name], MD  Patient coming from: Home  I have personally briefly reviewed patient's old medical records in [facility]  Chief Complaint: SOB, COVID+  HPI: Thomas Silva is a 54 y.o. male with medical history significant of HLD.  Pt with several day h/o SOB and cough.  Presented to PCP's office today.  Tested COVID positive.  Pt has not had vaccine.   ED Course: Tm 102.7, CXR with bilateral infiltrates C/W COVID.  Desats to 87% on RA, currently satting well but on 4L via [place].   Review of Systems: As per HPI, otherwise all review of systems negative.  Past Medical History:  Diagnosis Date  . HLD (hyperlipidemia)     Past Surgical History:  Procedure Laterality Date  . VENTRAL HERNIA REPAIR  2017     reports that he has never smoked. He does not have any smokeless tobacco history on file. He reports that he does not drink alcohol and does not use drugs.  Allergies  Allergen Reactions  . Codeine Nausea And Vomiting    History reviewed. No pertinent family history. No reported sick contacts  Prior to Admission medications   Medication Sig Start Date End Date Taking? Authorizing Provider  atorvastatin (LIPITOR) 40 MG tablet Take 40 mg by mouth daily.    [provider]  ezetimibe (ZETIA) 10 MG tablet Take 10 mg by mouth daily.    [provider]  omeprazole (PRILOSEC) 40 MG capsule Take 40 mg by mouth daily.    [provider]  predniSONE (DELTASONE) 10 MG tablet Take 1 tablet (10 mg total) by mouth 2 (two) times daily. 03/17/11   Ruby Cola, PA-C    Physical Exam: Vitals:   09/01/19 2103 09/01/19 2332 09/01/19 2345 09/02/19 0000  BP: 122/70 124/73 116/73 110/62  Pulse: (!) 117 (!) 104 (!) 102 100  Resp: 20 (!) 22 (!) 37 (!) 33  Temp: 100.2 F (37.9 C)     TempSrc: Oral     SpO2: 94% 96% 94% 95%  Weight:      Height:         Constitutional: NAD, calm, comfortable Eyes: PERRL, lids and conjunctivae normal ENMT: Mucous membranes are moist. Posterior pharynx clear of any exudate or lesions.Normal dentition.  Neck: normal, supple, no masses, no thyromegaly Respiratory: clear to auscultation bilaterally, no wheezing, no crackles. Normal respiratory effort. No accessory muscle use.  Cardiovascular: Regular rate and rhythm, no murmurs / rubs / gallops. No extremity edema. 2+ pedal pulses. No carotid bruits.  Abdomen: no tenderness, no masses palpated. No hepatosplenomegaly. Bowel sounds positive.  Musculoskeletal: no clubbing / cyanosis. No joint deformity upper and lower extremities. Good ROM, no contractures. Normal muscle tone.  Skin: no rashes, lesions, ulcers. No induration Neurologic: CN 2-12 grossly intact. Sensation intact, DTR normal. Strength 5/5 in all 4.  Psychiatric: Normal judgment and insight. Alert and oriented x 3. Normal mood.    Labs on Admission: I have personally reviewed following labs and imaging studies  CBC: Recent Labs  Lab 09/01/19 1755  WBC 5.7  NEUTROABS 4.6  HGB 18.5*  HCT 57.1*  MCV 88.4  PLT 157   Basic Metabolic Panel: Recent Labs  Lab 09/01/19 1755  NA 137  K 3.8  CL 98  CO2 24  GLUCOSE 230*  BUN 15  CREATININE 1.28*  CALCIUM 8.9   GFR: Estimated Creatinine Clearance: 82.8 mL/min (  A) (by C-G formula based on SCr of 1.28 mg/dL (H)). Liver Function Tests: Recent Labs  Lab 09/01/19 1755  AST 120*  ALT 78*  ALKPHOS 74  BILITOT 1.0  PROT 7.9  ALBUMIN 3.5   No results for input(s): LIPASE, AMYLASE in the last 168 hours. No results for input(s): AMMONIA in the last 168 hours. Coagulation Profile: No results for input(s): INR, PROTIME in the last 168 hours. Cardiac Enzymes: No results for input(s): CKTOTAL, CKMB, CKMBINDEX, TROPONINI in the last 168 hours. BNP (last 3 results) No results for input(s): PROBNP in the last 8760 hours. HbA1C: No results  for input(s): HGBA1C in the last 72 hours. CBG: No results for input(s): GLUCAP in the last 168 hours. Lipid Profile: Recent Labs    09/01/19 2202  TRIG 198*   Thyroid Function Tests: No results for input(s): TSH, T4TOTAL, FREET4, T3FREE, THYROIDAB in the last 72 hours. Anemia Panel: Recent Labs    09/01/19 2202  FERRITIN 503*   Urine analysis:    Component Value Date/Time   COLORURINE YELLOW 09/01/2019 1737   APPEARANCEUR CLEAR 09/01/2019 1737   LABSPEC 1.039 (H) 09/01/2019 1737   PHURINE 6.0 09/01/2019 1737   GLUCOSEU >=500 (A) 09/01/2019 1737   HGBUR MODERATE (A) 09/01/2019 1737   BILIRUBINUR NEGATIVE 09/01/2019 1737   KETONESUR 20 (A) 09/01/2019 1737   PROTEINUR >=300 (A) 09/01/2019 1737   NITRITE NEGATIVE 09/01/2019 1737   LEUKOCYTESUR NEGATIVE 09/01/2019 1737    Radiological Exams on Admission: DG Chest Portable 1 View  Result Date: 09/01/2019 CLINICAL DATA:  54 year old male with positive COVID-19 EXAM: PORTABLE CHEST 1 VIEW COMPARISON:  Chest radiograph dated 05/07 FINDINGS: Bilateral streaky densities most consistent with infiltrate, likely viral or atypical in etiology and in keeping with COVID-19. Clinical correlation is recommended. No pleural effusion or pneumothorax. The cardiac silhouette is within limits. No acute osseous pathology. IMPRESSION: Bilateral streaky densities most consistent with COVID-19 infiltrate. Electronically Signed   By: Elgie Collard M.D.   On: 09/01/2019 18:12    EKG: Independently reviewed.  Assessment/Plan Active Problems:   Acute hypoxemic respiratory failure due to COVID-19 (HCC)    1. COVID-19 with acute hypoxic resp failure - 1. COVID-19 pathway 2. remdesivir 3. Decadron 4. O2 via [place] 5. Cont pulse ox 6. Tele monitor 7. Daily labs in AM 8. WBC nl, procalcitonin 0.55 9. Will repeat procalcitonin in AM 10. Will hold off on starting ABx for the moment. 2. HLD - 1. Med rec pending  DVT prophylaxis: Lovenox Code  Status: Full Family Communication: No family in room Disposition Plan: Home after off O2 Consults called: None Admission status: Admit to inpatient  Severity of Illness: The appropriate patient status for this patient is INPATIENT. Inpatient status is judged to be reasonable and necessary in order to provide the required intensity of service to ensure the patient's safety. The patient's presenting symptoms, physical exam findings, and initial radiographic and laboratory data in the context of their chronic comorbidities is felt to place them at high risk for further clinical deterioration. Furthermore, it is not anticipated that the patient will be medically stable for discharge from the hospital within 2 midnights of admission. The following factors support the patient status of inpatient.   IP status due to COVID-19 with O2 requirement.   * I certify that at the point of admission it is my clinical judgment that the patient will require inpatient hospital care spanning beyond 2 midnights from the point of admission due to  high intensity of service, high risk for further deterioration and high frequency of surveillance required.*    Sharae Zappulla M. DO [facility]  How to contact the [facility] Attending or Consulting provider 7A - 7P or covering provider during after hours 7P -7A, for this patient?  1. Check the care team in [facility] and look for a) attending/consulting [facility] provider listed and b) the [facility] team listed 2. Log into [email or web address]  Amion Physician Scheduling and messaging for groups and whole hospitals  On call and physician scheduling software for group practices, residents, hospitalists and other medical providers for call, clinic, rotation and shift schedules. OnCall Enterprise is a hospital-wide system for scheduling doctors and paging doctors on call. EasyPlot is for scientific plotting and data analysis.  [email or web address]  and use [place]'s universal password to access. If  you do not have the password, please contact the hospital operator.  3. Locate the [facility] provider you are looking for under [facility] and page to a number that you can be directly reached. 4. If you still have difficulty reaching the provider, please page the [facility] (Director on Call) for the Hospitalists listed on amion for assistance.  09/02/2019, 12:06 AM

## 2019-09-02 NOTE — Plan of Care (Signed)
  Problem: Education: Goal: Knowledge of risk factors and measures for prevention of condition will improve Outcome: Progressing   Problem: Coping: Goal: Psychosocial and spiritual needs will be supported Outcome: Progressing   Problem: Respiratory: Goal: Will maintain a patent airway Outcome: Progressing Goal: Complications related to the disease process, condition or treatment will be avoided or minimized Outcome: Progressing   

## 2019-09-02 NOTE — Progress Notes (Signed)
Thomas Silva is a 54 y.o. male patient admitted from ED awake, alert - oriented  X 4 - no acute distress noted.  VSS - Blood pressure (!) 143/76, pulse 81, temperature 98.1 F (36.7 C), temperature source Oral, resp. rate 20, height 5\' 11"  (1.803 m), weight 108.9 kg, SpO2 91 %.    IV in place, occlusive dsg intact without redness.  Orientation to room, and floor completed with information packet given to patient/family.  Patient declined safety video at this time.  Admission INP armband ID verified with patient/family, and in place.   SR up x 2, fall assessment complete, with patient and family able to verbalize understanding of risk associated with falls, and verbalized understanding to call nsg before up out of bed.  Call light within reach, patient able to voice, and demonstrate understanding.  Skin, clean-dry- intact without evidence of bruising, or skin tears.   No evidence of skin break down noted on exam.     Will cont to eval and treat per MD orders.  , RN 09/02/2019 5:25 PM

## 2019-09-02 NOTE — Progress Notes (Addendum)
PROGRESS NOTE                                                                                                                                                                                                             Patient Demographics:    Thomas Silva, is a 54 y.o. male, DOB - 09/29/1965, ZOX:[id number]  Admit date - 09/01/2019   Admitting Physician Hillary BowJared M Gardner, DO  Outpatient Primary MD for the patient is Street, Stephanie Couphristopher M, MD  LOS - 1   Chief Complaint  Patient presents with  . COVID POS  . Shortness of Breath       Brief Narrative    This is a no charge note, this patient was seen and admitted earlier today by Dr. Julian ReilGardner, imaging, labs, and chart was reviewed.  HPI: Thomas Silva is a 54 y.o. male with medical history significant of HLD.  Pt with several day h/o SOB and cough.  Presented to PCP's office today.  Tested COVID positive.  Pt has not had vaccine.   ED Course: Tm 102.7, CXR with bilateral infiltrates C/W COVID.  Desats to 87% on RA, currently satting well but on 4L via [place].   Subjective:    Thomas Silva today has, No headache, No chest pain, No abdominal pain - No Nausea, he reports dyspnea and cough   Assessment  & Plan :    Active Problems:   Acute hypoxemic respiratory failure due to COVID-19 [facility](HCC)  Acute hypoxic respiratory failure due to COVID-19 pneumonia: -N 40 of pneumonia, he is requiring 4 L nasal cannula, continue with IV steroids, IV remdesivir, procalcitonin trending up slightly, so we will continue with IV Rocephin and azithromycin, this can be discontinued in 24 hours if it is trending down, he was encouraged with incentive spirometry, flutter valve, out of bed to chair and to prone. -Patient reports history of hepatitis B, so he won't to be a candidate for Actemra.  Hyperlipidemia -Continue with home dose statin  Hypothyroidism -Tinea with Synthroid  Diabetes mellitus -Check A1c, hold  Metformin and Jardiance, continue with Tradjenta, will start on insulin sliding scale, and Levemir twice daily  AKI -Lisinopril, continue with gentle hydration  GERD -Continue with PPI  COVID-19 Labs  Recent Labs    09/01/19 2202 09/02/19 0406  DDIMER 1.73* 1.55*  FERRITIN 503*  --  LDH 528*  --   CRP 5.4* 5.6*    Lab Results  Component Value Date   SARSCOV2NAA POSITIVE (A) 09/01/2019     Code Status : Full  Family Communication  : And is admitted overnight, will update family tomorrow  Disposition Plan  :  Status is: Inpatient  Remains inpatient appropriate because:IV treatments appropriate due to intensity of illness or inability to take PO   Dispo: The patient is from: Home              Anticipated d/c is to: Home              Anticipated d/c date is: > 3 days              Patient currently is not medically stable to d/c.       Consults  :  None  Procedures  : None  DVT Prophylaxis  :  [place] lovenox  Lab Results  Component Value Date   PLT 127 (L) 09/02/2019    Antibiotics  :    Anti-infectives (From admission, onward)   Start     Dose/Rate Route Frequency Ordered Stop   09/03/19 1000  remdesivir 100 mg in sodium chloride 0.9 % 100 mL IVPB     Discontinue    "Followed by" Linked Group Details   100 mg 200 mL/hr over 30 Minutes Intravenous Daily 09/01/19 2335 09/07/19 0959   09/02/19 2200  azithromycin (ZITHROMAX) 500 mg in sodium chloride 0.9 % 250 mL IVPB     Discontinue     500 mg 250 mL/hr over 60 Minutes Intravenous Every 24 hours 09/02/19 0832 09/06/19 2159   09/02/19 2200  cefTRIAXone (ROCEPHIN) 1 g in sodium chloride 0.9 % 100 mL IVPB     Discontinue     1 g 200 mL/hr over 30 Minutes Intravenous Every 24 hours 09/02/19 0832     09/02/19 0100  remdesivir 200 mg in sodium chloride 0.9% 250 mL IVPB       "Followed by" Linked Group Details   200 mg 580 mL/hr over 30 Minutes Intravenous Once 09/01/19 2335 09/02/19 0119   09/01/19 2200   azithromycin (ZITHROMAX) 500 mg in sodium chloride 0.9 % 250 mL IVPB        500 mg 250 mL/hr over 60 Minutes Intravenous  Once 09/01/19 2158 09/02/19 0033   09/01/19 2200  cefTRIAXone (ROCEPHIN) 1 g in sodium chloride 0.9 % 100 mL IVPB        1 g 200 mL/hr over 30 Minutes Intravenous  Once 09/01/19 2158 09/01/19 2306        Objective:   Vitals:   09/02/19 0500 09/02/19 0600 09/02/19 0855 09/02/19 1257  BP: 114/70 116/71 129/74 135/75  Pulse: 68 67 82 79  Resp: (!) 21 (!) 30 (!) 28 (!) 26  Temp:   97.8 F (36.6 C)   TempSrc:   Oral   SpO2: 95% 95% 94% 92%  Weight:      Height:        Wt Readings from Last 3 Encounters:  09/01/19 108.9 kg     Intake/Output Summary (Last 24 hours) at 09/02/2019 1500 Last data filed at 09/02/2019 1346 Gross per 24 hour  Intake --  Output 1400 ml  Net -1400 ml     Physical Exam  Awake Alert, Oriented X 3, No new F.N deficits, Normal affect  Symmetrical Chest wall movement, Good air movement bilaterally, CTAB RRR,No Gallops,Rubs or new Murmurs, No  Parasternal Heave +ve B.Sounds, Abd Soft, No tenderness, No rebound - guarding or rigidity. No Cyanosis, Clubbing or edema, No new Rash or bruise      Data Review:    CBC Recent Labs  Lab 09/01/19 1755 09/02/19 0406  WBC 5.7 4.4  HGB 18.5* 15.5  HCT 57.1* 49.9  PLT 157 127*  MCV 88.4 91.4  MCH 28.6 28.4  MCHC 32.4 31.1  RDW 14.1 14.2  LYMPHSABS 0.7 0.4*  MONOABS 0.4 0.2  EOSABS 0.0 0.0  BASOSABS 0.0 0.0    Chemistries  Recent Labs  Lab 09/01/19 1755 09/02/19 0406  NA 137 136  K 3.8 4.9  CL 98 99  CO2 24 19*  GLUCOSE 230* 241*  BUN 15 24*  CREATININE 1.28* 1.50*  CALCIUM 8.9 7.8*  AST 120* 117*  ALT 78* 63*  ALKPHOS 74 57  BILITOT 1.0 2.1*   ------------------------------------------------------------------------------------------------------------------ Recent Labs    09/01/19 2202  TRIG 198*    No results found for:  HGBA1C ------------------------------------------------------------------------------------------------------------------ No results for input(s): TSH, T4TOTAL, T3FREE, THYROIDAB in the last 72 hours.  Invalid input(s): FREET3 ------------------------------------------------------------------------------------------------------------------ Recent Labs    09/01/19 2202  FERRITIN 503*    Coagulation profile No results for input(s): INR, PROTIME in the last 168 hours.  Recent Labs    09/01/19 2202 09/02/19 0406  DDIMER 1.73* 1.55*    Cardiac Enzymes No results for input(s): CKMB, TROPONINI, MYOGLOBIN in the last 168 hours.  Invalid input(s): CK ------------------------------------------------------------------------------------------------------------------ No results found for: BNP  Inpatient Medications  Scheduled Meds: . AeroChamber Plus Flo-Vu Large  1 each Other Once  . albuterol  2 puff Inhalation Q6H  . [START ON 09/03/2019] dexamethasone (DECADRON) injection  10 mg Intravenous Daily  . enoxaparin (LOVENOX) injection  40 mg Subcutaneous Daily  . pantoprazole  40 mg Oral Daily  . sodium chloride flush  3 mL Intravenous Once   Continuous Infusions: . sodium chloride 1,000 mL (09/02/19 0849)  . azithromycin (ZITHROMAX) 500 MG IVPB (Vial-Mate [employer]or)    . cefTRIAXone (ROCEPHIN)  IV    . [START ON 09/03/2019] remdesivir 100 mg in NS 100 mL     PRN Meds:.acetaminophen, albuterol, chlorpheniramine-HYDROcodone, guaiFENesin-dextromethorphan, ondansetron **OR** ondansetron (ZOFRAN) IV  Micro Results Recent Results (from the past 240 hour(s))  SARS Coronavirus 2 by RT PCR (hospital order, performed in [facility] hospital lab) Nasopharyngeal Nasopharyngeal Swab     Status: Abnormal   Collection Time: 09/01/19  9:58 PM   Specimen: Nasopharyngeal Swab  Result Value Ref Range Status   SARS Coronavirus 2 POSITIVE (A) NEGATIVE Final    Comment: RESULT CALLED TO, READ BACK BY  AND VERIFIED WITH: J SAWAPZKI RN 09/01/19 2358 JDW (NOTE) SARS-CoV-2 target nucleic acids are DETECTED  SARS-CoV-2 RNA is generally detectable in upper respiratory specimens  during the acute phase of infection.  Positive results are indicative  of the presence of the identified virus, but do not rule out bacterial infection or co-infection with other pathogens not detected by the test.  Clinical correlation with patient history and  other diagnostic information is necessary to determine patient infection status.  The expected result is negative.  Fact Sheet for Patients:   BoilerBrush.com.cy   Fact Sheet for Healthcare Providers:   https://pope.com/    This test is not yet approved or cleared by the [place] FDA and  has been authorized for detection and/or diagnosis of SARS-CoV-2 by FDA under an Emergency Use Authorization (EUA).  This EUA will remain in  effect (meaning this test ca n be used) for the duration of  the COVID-19 declaration under Section 564(b)(1) of the Act, 21 U.S.C. section 360-bbb-3(b)(1), unless the authorization is terminated or revoked sooner.  Performed at [facility], [address] [place], [place] [address]     Radiology Reports DG Chest Portable 1 View  Result Date: 09/01/2019 CLINICAL DATA:  54 year old male with positive COVID-19 EXAM: PORTABLE CHEST 1 VIEW COMPARISON:  Chest radiograph dated 05/07 FINDINGS: Bilateral streaky densities most consistent with infiltrate, likely viral or atypical in etiology and in keeping with COVID-19. Clinical correlation is recommended. No pleural effusion or pneumothorax. The cardiac silhouette is within limits. No acute osseous pathology. IMPRESSION: Bilateral streaky densities most consistent with COVID-19 infiltrate. Electronically Signed   By: Elgie Collard M.D.   On: 09/01/2019 18:12      Huey Bienenstock M.D on 09/02/2019 at 3:00 PM    [facility] -  Office  [phone]

## 2019-09-03 DIAGNOSIS — J9601 Acute respiratory failure with hypoxia: Secondary | ICD-10-CM | POA: Diagnosis not present

## 2019-09-03 DIAGNOSIS — U071 COVID-19: Secondary | ICD-10-CM | POA: Diagnosis not present

## 2019-09-03 DIAGNOSIS — J1282 Pneumonia due to Coronavirus disease 2019: Secondary | ICD-10-CM | POA: Diagnosis not present

## 2019-09-03 DIAGNOSIS — E118 Type 2 diabetes mellitus with unspecified complications: Secondary | ICD-10-CM | POA: Diagnosis not present

## 2019-09-03 DIAGNOSIS — E1165 Type 2 diabetes mellitus with hyperglycemia: Secondary | ICD-10-CM

## 2019-09-03 LAB — CBC WITH DIFFERENTIAL/PLATELET
Abs Immature Granulocytes: 0.04 10*3/uL (ref 0.00–0.07)
Basophils Absolute: 0 10*3/uL (ref 0.0–0.1)
Basophils Relative: 0 %
Eosinophils Absolute: 0 10*3/uL (ref 0.0–0.5)
Eosinophils Relative: 0 %
HCT: 49.4 % (ref 39.0–52.0)
Hemoglobin: 15.8 g/dL (ref 13.0–17.0)
Immature Granulocytes: 1 %
Lymphocytes Relative: 8 %
Lymphs Abs: 0.5 10*3/uL — ABNORMAL LOW (ref 0.7–4.0)
MCH: 28.9 pg (ref 26.0–34.0)
MCHC: 32 g/dL (ref 30.0–36.0)
MCV: 90.3 fL (ref 80.0–100.0)
Monocytes Absolute: 0.5 10*3/uL (ref 0.1–1.0)
Monocytes Relative: 7 %
Neutro Abs: 5.5 10*3/uL (ref 1.7–7.7)
Neutrophils Relative %: 84 %
Platelets: 171 10*3/uL (ref 150–400)
RBC: 5.47 MIL/uL (ref 4.22–5.81)
RDW: 14.4 % (ref 11.5–15.5)
WBC: 6.5 10*3/uL (ref 4.0–10.5)
nRBC: 0 % (ref 0.0–0.2)

## 2019-09-03 LAB — COMPREHENSIVE METABOLIC PANEL
ALT: 55 U/L — ABNORMAL HIGH (ref 0–44)
AST: 78 U/L — ABNORMAL HIGH (ref 15–41)
Albumin: 2.6 g/dL — ABNORMAL LOW (ref 3.5–5.0)
Alkaline Phosphatase: 47 U/L (ref 38–126)
Anion gap: 14 (ref 5–15)
BUN: 26 mg/dL — ABNORMAL HIGH (ref 6–20)
CO2: 21 mmol/L — ABNORMAL LOW (ref 22–32)
Calcium: 8.1 mg/dL — ABNORMAL LOW (ref 8.9–10.3)
Chloride: 104 mmol/L (ref 98–111)
Creatinine, Ser: 1.04 mg/dL (ref 0.61–1.24)
GFR calc Af Amer: >60 mL/min (ref >60)
GFR calc non Af Amer: >60 mL/min (ref >60)
Glucose, Bld: 190 mg/dL — ABNORMAL HIGH (ref 70–99)
Potassium: 4.2 mmol/L (ref 3.5–5.1)
Sodium: 139 mmol/L (ref 135–145)
Total Bilirubin: 1.1 mg/dL (ref 0.3–1.2)
Total Protein: 5.9 g/dL — ABNORMAL LOW (ref 6.5–8.1)

## 2019-09-03 LAB — PROCALCITONIN: Procalcitonin: 0.4 ng/mL

## 2019-09-03 LAB — GLUCOSE, CAPILLARY
Glucose-Capillary: 186 mg/dL — ABNORMAL HIGH (ref 70–99)
Glucose-Capillary: 231 mg/dL — ABNORMAL HIGH (ref 70–99)
Glucose-Capillary: 260 mg/dL — ABNORMAL HIGH (ref 70–99)
Glucose-Capillary: 261 mg/dL — ABNORMAL HIGH (ref 70–99)
Glucose-Capillary: 243 mg/dL — ABNORMAL HIGH (ref 70–99)

## 2019-09-03 LAB — HEMOGLOBIN A1C
Hgb A1c MFr Bld: 8.5 % — ABNORMAL HIGH (ref 4.8–5.6)
Mean Plasma Glucose: 197.25 mg/dL

## 2019-09-03 LAB — D-DIMER, QUANTITATIVE: D-Dimer, Quant: 1.07 ug/mL-FEU — ABNORMAL HIGH (ref 0.00–0.50)

## 2019-09-03 LAB — C-REACTIVE PROTEIN: CRP: 4.4 mg/dL — ABNORMAL HIGH (ref <1.0)

## 2019-09-03 MED ORDER — INSULIN ASPART 100 UNIT/ML ~~LOC~~ SOLN
5.0000 [IU] | Freq: Three times a day (TID) | SUBCUTANEOUS | Status: DC
  Administered 2019-09-03 – 2019-09-04 (×3): 5 [IU] via SUBCUTANEOUS

## 2019-09-03 MED ORDER — THIAMINE HCL 100 MG PO TABS
100.0000 mg | ORAL_TABLET | Freq: Every day | ORAL | Status: DC
  Administered 2019-09-03 – 2019-09-16 (×14): 100 mg via ORAL
  Filled 2019-09-03 (×14): qty 1

## 2019-09-03 MED ORDER — INSULIN DETEMIR 100 UNIT/ML ~~LOC~~ SOLN
10.0000 [IU] | Freq: Two times a day (BID) | SUBCUTANEOUS | Status: DC
  Administered 2019-09-03 – 2019-09-04 (×2): 10 [IU] via SUBCUTANEOUS
  Filled 2019-09-03 (×3): qty 0.1

## 2019-09-03 MED ORDER — ZINC SULFATE 220 (50 ZN) MG PO CAPS
220.0000 mg | ORAL_CAPSULE | Freq: Every day | ORAL | Status: DC
  Administered 2019-09-03 – 2019-09-16 (×14): 220 mg via ORAL
  Filled 2019-09-03 (×14): qty 1

## 2019-09-03 NOTE — Progress Notes (Signed)
PROGRESS NOTE    Thomas BlankVincent Castoro  ZOX:[id number] DOB: 1966-01-27 DOA: 09/01/2019 PCP: Casper HarrisonStreet, Stephanie Couphristopher M, MD     Brief Narrative:  Thomas Silva is a 54 y.o. WM PMHx HLD.  Pt with several day h/o SOB and cough.  Presented to PCP's office today.  Tested COVID positive.  Pt has not had vaccine.   ED Course: Tm 102.7, CXR with bilateral infiltrates C/W COVID.  Desats to 87% on RA, currently satting well but on 4L via [place].   Subjective: A/O x4, positive S OB, negative CP, negative nausea, negative vomiting,   Assessment & Plan: Covid vaccination; no vaccination   Active Problems:   Acute hypoxemic respiratory failure due to COVID-19 [facility](HCC)  Acute respiratory failure with hypoxia/Covid 19 pneumonia COVID-19 Labs  Recent Labs    09/01/19 2202 09/02/19 0406 09/03/19 0412  DDIMER 1.73* 1.55* 1.07*  FERRITIN 503*  --   --   LDH 528*  --   --   CRP 5.4* 5.6* 4.4*    Lab Results  Component Value Date   SARSCOV2NAA POSITIVE (A) 09/01/2019   -Remdesivir -Decadron IV 10 mg daily -Positive history hepatitis B therefore not Candidate for Actemra -Albuterol QID -Titrate O2 to maintain SPO2> 88% -Flutter valve -Incentive spirometry -Thiamine 100 mg daily -Folic acid 1 mg daily -Zinc 220 mg daily -Prone patient 8 hours/day; if patient cannot tolerate prone 2 to 3 hours per shift  Acute hypoxic respiratory failure due to COVID-19 pneumonia: -N 40 of pneumonia, he is requiring 4 L nasal cannula, continue with IV steroids, IV remdesivir, procalcitonin trending up slightly, so we will continue with IV Rocephin and azithromycin, this can be discontinued in 24 hours if it is trending down, he was encouraged with incentive spirometry, flutter valve, out of bed to chair and to prone. -Patient reports history of hepatitis B, so he won't to be a candidate for Actemra.  CAP -Continue empiric antibiotics 5 to 7-day course -8/4 sputum sample pending -8/4 respiratory viral panel  pending  -Hyperlipidemia -Continue with home dose statin  Hypothyroidism -Tinea with Synthroid  DM type II uncontrolled with complication -8/4 hemoglobin A1c= 8.5 -8/4 increase Levemir 10 units BID -8/4 NovoLog 5 units qac -Moderate SSI -Tradjenta 5 mg daily   AKI (baseline Cr 1.0) Recent Labs  Lab 09/01/19 1755 09/02/19 0406 09/03/19 0412  CREATININE 1.28* 1.50* 1.04  -At baseline  GERD -Continue with PPI   DVT prophylaxis: Lovenox Code Status: Full Family Communication:  Status is: Inpatient    Dispo: The patient is from: Home              Anticipated d/c is to: Home              Anticipated d/c date is:??              Patient currently       Consultants:    Procedures/Significant Events:    I have personally reviewed and interpreted all radiology studies and my findings are as above.  VENTILATOR SETTINGS: HF [place] 8/4 Flow; 14 L/min SPO2; 90%   Cultures   Antimicrobials: Anti-infectives (From admission, onward)   Start     Ordered Stop   09/03/19 1000  remdesivir 100 mg in sodium chloride 0.9 % 100 mL IVPB     Discontinue    "Followed by" Linked Group Details   09/01/19 2335 09/07/19 0959   09/02/19 2200  azithromycin (ZITHROMAX) 500 mg in sodium chloride 0.9 % 250 mL IVPB  Discontinue     09/02/19 0832 09/06/19 2159   09/02/19 2200  cefTRIAXone (ROCEPHIN) 1 g in sodium chloride 0.9 % 100 mL IVPB     Discontinue     09/02/19 0832     09/02/19 1600  valACYclovir (VALTREX) tablet 1,000 mg     Discontinue     09/02/19 1510     09/02/19 0100  remdesivir 200 mg in sodium chloride 0.9% 250 mL IVPB       "Followed by" Linked Group Details   09/01/19 2335 09/02/19 0119   09/01/19 2200  azithromycin (ZITHROMAX) 500 mg in sodium chloride 0.9 % 250 mL IVPB        09/01/19 2158 09/02/19 0033   09/01/19 2200  cefTRIAXone (ROCEPHIN) 1 g in sodium chloride 0.9 % 100 mL IVPB        09/01/19 2158 09/01/19 2306       Devices    LINES /  TUBES:      Continuous Infusions: . azithromycin (ZITHROMAX) 500 MG IVPB (Vial-Mate Adaptor) Stopped (09/03/19 0109)  . cefTRIAXone (ROCEPHIN)  IV Stopped (09/02/19 2309)  . remdesivir 100 mg in NS 100 mL 100 mg (09/03/19 0932)     Objective: Vitals:   09/03/19 0325 09/03/19 0415 09/03/19 0607 09/03/19 1245  BP:  (!) 144/71  118/76  Pulse: 71 77  74  Resp: 14 (!) 21  (!) 22  Temp:  98 F (36.7 C)  98.3 F (36.8 C)  TempSrc:  Oral  Oral  SpO2: 92% 90% 90% 91%  Weight:      Height:        Intake/Output Summary (Last 24 hours) at 09/03/2019 1840 Last data filed at 09/03/2019 1641 Gross per 24 hour  Intake 1950 ml  Output 2800 ml  Net -850 ml   Filed Weights   09/01/19 1805  Weight: 108.9 kg    Examination:  General: Positive acute respiratory distress Eyes: negative scleral hemorrhage, negative anisocoria, negative icterus ENT: Negative Runny nose, negative gingival bleeding, Neck:  Negative scars, masses, torticollis, lymphadenopathy, JVD Lungs: tachypneic, diffuse poor air movement without wheezes or crackles Cardiovascular: Tachycardic, without murmur gallop or rub normal S1 and S2 Abdomen: negative abdominal pain, nondistended, positive soft, bowel sounds, no rebound, no ascites, no appreciable mass Extremities: No significant cyanosis, clubbing, or edema bilateral lower extremities Skin: Negative rashes, lesions, ulcers Psychiatric:  Negative depression, negative anxiety, negative fatigue, negative mania  Central nervous system:  Cranial nerves II through XII intact, tongue/uvula midline, all extremities muscle strength 5/5, sensation intact throughout,  negative dysarthria, negative expressive aphasia, negative receptive aphasia.  .     Data Reviewed: Care during the described time interval was provided by me .  I have reviewed this patient's available data, including medical history, events of note, physical examination, and all test results as part of my  evaluation.  CBC: Recent Labs  Lab 09/01/19 1755 09/02/19 0406 09/03/19 0412  WBC 5.7 4.4 6.5  NEUTROABS 4.6 3.8 5.5  HGB 18.5* 15.5 15.8  HCT 57.1* 49.9 49.4  MCV 88.4 91.4 90.3  PLT 157 127* 171   Basic Metabolic Panel: Recent Labs  Lab 09/01/19 1755 09/02/19 0406 09/03/19 0412  NA 137 136 139  K 3.8 4.9 4.2  CL 98 99 104  CO2 24 19* 21*  GLUCOSE 230* 241* 190*  BUN 15 24* 26*  CREATININE 1.28* 1.50* 1.04  CALCIUM 8.9 7.8* 8.1*   GFR: Estimated Creatinine Clearance: 101.9 mL/min (by C-G formula  based on SCr of 1.04 mg/dL). Liver Function Tests: Recent Labs  Lab 09/01/19 1755 09/02/19 0406 09/03/19 0412  AST 120* 117* 78*  ALT 78* 63* 55*  ALKPHOS 74 57 47  BILITOT 1.0 2.1* 1.1  PROT 7.9 6.1* 5.9*  ALBUMIN 3.5 2.8* 2.6*   No results for input(s): LIPASE, AMYLASE in the last 168 hours. No results for input(s): AMMONIA in the last 168 hours. Coagulation Profile: No results for input(s): INR, PROTIME in the last 168 hours. Cardiac Enzymes: No results for input(s): CKTOTAL, CKMB, CKMBINDEX, TROPONINI in the last 168 hours. BNP (last 3 results) No results for input(s): PROBNP in the last 8760 hours. HbA1C: Recent Labs    09/03/19 0412  HGBA1C 8.5*   CBG: Recent Labs  Lab 09/02/19 1618 09/02/19 2112 09/03/19 0754 09/03/19 1138 09/03/19 1617  GLUCAP 189* 208* 186* 231* 260*   Lipid Profile: Recent Labs    09/01/19 2202  TRIG 198*   Thyroid Function Tests: No results for input(s): TSH, T4TOTAL, FREET4, T3FREE, THYROIDAB in the last 72 hours. Anemia Panel: Recent Labs    09/01/19 2202  FERRITIN 503*   Sepsis Labs: Recent Labs  Lab 09/01/19 1755 09/01/19 2202 09/01/19 2349 09/02/19 0406 09/03/19 0412  PROCALCITON  --  0.55  --  0.59 0.40  LATICACIDVEN 2.6*  --  1.7  --   --     Recent Results (from the past 240 hour(s))  Blood culture (routine x 2)     Status: None (Preliminary result)   Collection Time: 09/01/19  5:56 PM    Specimen: BLOOD  Result Value Ref Range Status   Specimen Description BLOOD RIGHT ANTECUBITAL  Final   Special Requests   Final    BOTTLES DRAWN AEROBIC AND ANAEROBIC Blood Culture adequate volume   Culture   Final    NO GROWTH 2 DAYS Performed at [facility], [address] [place], [place] [address]    Report Status PENDING  Incomplete  SARS Coronavirus 2 by RT PCR (hospital order, performed in [facility] hospital lab) Nasopharyngeal Nasopharyngeal Swab     Status: Abnormal   Collection Time: 09/01/19  9:58 PM   Specimen: Nasopharyngeal Swab  Result Value Ref Range Status   SARS Coronavirus 2 POSITIVE (A) NEGATIVE Final    Comment: RESULT CALLED TO, READ BACK BY AND VERIFIED WITH: J SAWAPZKI RN 09/01/19 2358 JDW (NOTE) SARS-CoV-2 target nucleic acids are DETECTED  SARS-CoV-2 RNA is generally detectable in upper respiratory specimens  during the acute phase of infection.  Positive results are indicative  of the presence of the identified virus, but do not rule out bacterial infection or co-infection with other pathogens not detected by the test.  Clinical correlation with patient history and  other diagnostic information is necessary to determine patient infection status.  The expected result is negative.  Fact Sheet for Patients:   BoilerBrush.com.cy   Fact Sheet for Healthcare Providers:   https://pope.com/    This test is not yet approved or cleared by the [place] FDA and  has been authorized for detection and/or diagnosis of SARS-CoV-2 by FDA under an Emergency Use Authorization (EUA).  This EUA will remain in effect (meaning this test ca n be used) for the duration of  the COVID-19 declaration under Section 564(b)(1) of the Act, 21 U.S.C. section 360-bbb-3(b)(1), unless the authorization is terminated or revoked sooner.  Performed at [facility], [address] [place], [place] [address]   Blood  culture (routine x 2)     Status: None (Preliminary result)   Collection Time: 09/01/19 10:02 PM   Specimen: BLOOD  Result Value Ref Range Status   Specimen Description BLOOD SITE NOT SPECIFIED  Final   Special Requests   Final    BOTTLES DRAWN AEROBIC AND ANAEROBIC Blood Culture adequate volume   Culture   Final    NO GROWTH 2 DAYS Performed at [facility], [address], [place], [place] [address]    Report Status PENDING  Incomplete         Radiology Studies: No results found.      Scheduled Meds: . AeroChamber Plus Flo-Vu Large  1 each Other Once  . albuterol  2 puff Inhalation Q6H  . aspirin EC  81 mg Oral Daily  . dexamethasone (DECADRON) injection  10 mg Intravenous Daily  . enoxaparin (LOVENOX) injection  40 mg Subcutaneous Daily  . feeding supplement (ENSURE ENLIVE)  237 mL Oral BID BM  . folic acid  1,000 mcg Oral Daily  . gabapentin  300 mg Oral Daily  . gabapentin  600 mg Oral QHS  . insulin aspart  0-15 Units Subcutaneous TID WC  . insulin aspart  0-5 Units Subcutaneous QHS  . insulin detemir  5 Units Subcutaneous BID  . levothyroxine  25 mcg Oral QAC breakfast  . linagliptin  5 mg Oral Daily  . lisinopril  2.5 mg Oral Daily  . pantoprazole  40 mg Oral Daily  . rosuvastatin  10 mg Oral QPM  . sodium chloride flush  3 mL Intravenous Once  . valACYclovir  1,000 mg Oral Daily   Continuous Infusions: . azithromycin (ZITHROMAX) 500 MG IVPB (Vial-Mate [employer]or) Stopped (09/03/19 0109)  . cefTRIAXone (ROCEPHIN)  IV Stopped (09/02/19 2309)  . remdesivir 100 mg in NS 100 mL 100 mg (09/03/19 0932)     LOS: 2 days    Time spent:40 min    Lilliana Turner, Roselind Messier, MD [facility] Pager [phone]  If 7PM-7AM, please contact night-coverage [email or web address] Password [facility] 09/03/2019, 6:40 PM

## 2019-09-03 NOTE — Progress Notes (Signed)
Initial Nutrition Assessment  DOCUMENTATION CODES:   Obesity unspecified  INTERVENTION:  Continue Ensure Enlive po BID, each supplement provides 350 kcal and 20 grams of protein  Encourage adequate PO intake.   NUTRITION DIAGNOSIS:   Increased nutrient needs related to acute illness (COVID) as evidenced by estimated needs.  GOAL:   Patient will meet greater than or equal to 90% of their needs  MONITOR:   PO intake, Supplement acceptance, Skin, Weight trends, Labs, I & O's  REASON FOR ASSESSMENT:   Malnutrition Screening Tool    ASSESSMENT:   54 y.o. male with medical history significant of HLD.  Pt with several day h/o SOB and cough. Tested COVID positive.   RD working remotely.  Pt unavailable during attempted time of contact. RD unable to obtain pt nutrition history at this time. Meal completion has been 100%. Pt currently has Ensure ordered. RD to continue with current orders to aid in caloric and protein needs. Unable to complete Nutrition-Focused physical exam at this time.   Labs and medications reviewed.   Diet Order:   Diet Order            Diet Heart Room service appropriate? Yes; Fluid consistency: Thin  Diet effective now                 EDUCATION NEEDS:   Not appropriate for education at this time  Skin:  Skin Assessment: Reviewed RN Assessment  Last BM:  8/2  Height:   Ht Readings from Last 1 Encounters:  09/01/19 5\' 11"  (1.803 m)    Weight:   Wt Readings from Last 1 Encounters:  09/01/19 108.9 kg   BMI:  Body mass index is 33.47 kg/m.  Estimated Nutritional Needs:   Kcal:  2150-2350  Protein:  110-120 grams  Fluid:  >/= 2 L/day   01-07-1990, MS, RD, LDN RD pager number/after hours weekend pager number on Amion.

## 2019-09-03 NOTE — Plan of Care (Signed)
  Problem: Education: Goal: Knowledge of risk factors and measures for prevention of condition will improve Outcome: Progressing   Problem: Coping: Goal: Psychosocial and spiritual needs will be supported Outcome: Progressing   Problem: Respiratory: Goal: Will maintain a patent airway Outcome: Progressing   Problem: Education: Goal: Knowledge of General Education information will improve Description: Including pain rating scale, medication(s)/side effects and non-pharmacologic comfort measures Outcome: Progressing   Problem: Activity: Goal: Risk for activity intolerance will decrease Outcome: Progressing   Problem: Nutrition: Goal: Adequate nutrition will be maintained Outcome: Progressing   Problem: Respiratory: Goal: Complications related to the disease process, condition or treatment will be avoided or minimized Outcome: Not Progressing

## 2019-09-04 DIAGNOSIS — J1282 Pneumonia due to Coronavirus disease 2019: Secondary | ICD-10-CM | POA: Diagnosis not present

## 2019-09-04 DIAGNOSIS — E118 Type 2 diabetes mellitus with unspecified complications: Secondary | ICD-10-CM | POA: Diagnosis not present

## 2019-09-04 DIAGNOSIS — J9601 Acute respiratory failure with hypoxia: Secondary | ICD-10-CM | POA: Diagnosis not present

## 2019-09-04 DIAGNOSIS — U071 COVID-19: Secondary | ICD-10-CM | POA: Diagnosis not present

## 2019-09-04 LAB — COMPREHENSIVE METABOLIC PANEL
ALT: 52 U/L — ABNORMAL HIGH (ref 0–44)
AST: 70 U/L — ABNORMAL HIGH (ref 15–41)
Albumin: 2.6 g/dL — ABNORMAL LOW (ref 3.5–5.0)
Alkaline Phosphatase: 51 U/L (ref 38–126)
Anion gap: 11 (ref 5–15)
BUN: 20 mg/dL (ref 6–20)
CO2: 25 mmol/L (ref 22–32)
Calcium: 8.2 mg/dL — ABNORMAL LOW (ref 8.9–10.3)
Chloride: 102 mmol/L (ref 98–111)
Creatinine, Ser: 0.84 mg/dL (ref 0.61–1.24)
GFR calc Af Amer: >60 mL/min (ref >60)
GFR calc non Af Amer: >60 mL/min (ref >60)
Glucose, Bld: 171 mg/dL — ABNORMAL HIGH (ref 70–99)
Potassium: 3.8 mmol/L (ref 3.5–5.1)
Sodium: 138 mmol/L (ref 135–145)
Total Bilirubin: 1.1 mg/dL (ref 0.3–1.2)
Total Protein: 6 g/dL — ABNORMAL LOW (ref 6.5–8.1)

## 2019-09-04 LAB — PROCALCITONIN: Procalcitonin: <0.1 ng/mL

## 2019-09-04 LAB — CBC WITH DIFFERENTIAL/PLATELET
Abs Immature Granulocytes: 0.04 10*3/uL (ref 0.00–0.07)
Basophils Absolute: 0 10*3/uL (ref 0.0–0.1)
Basophils Relative: 0 %
Eosinophils Absolute: 0 10*3/uL (ref 0.0–0.5)
Eosinophils Relative: 0 %
HCT: 48.4 % (ref 39.0–52.0)
Hemoglobin: 15.7 g/dL (ref 13.0–17.0)
Immature Granulocytes: 1 %
Lymphocytes Relative: 5 %
Lymphs Abs: 0.4 10*3/uL — ABNORMAL LOW (ref 0.7–4.0)
MCH: 28.8 pg (ref 26.0–34.0)
MCHC: 32.4 g/dL (ref 30.0–36.0)
MCV: 88.8 fL (ref 80.0–100.0)
Monocytes Absolute: 0.4 10*3/uL (ref 0.1–1.0)
Monocytes Relative: 4 %
Neutro Abs: 7.5 10*3/uL (ref 1.7–7.7)
Neutrophils Relative %: 90 %
Platelets: 198 10*3/uL (ref 150–400)
RBC: 5.45 MIL/uL (ref 4.22–5.81)
RDW: 14 % (ref 11.5–15.5)
WBC: 8.3 10*3/uL (ref 4.0–10.5)
nRBC: 0 % (ref 0.0–0.2)

## 2019-09-04 LAB — LACTIC ACID, PLASMA
Lactic Acid, Venous: 2.9 mmol/L (ref 0.5–1.9)
Lactic Acid, Venous: 1.9 mmol/L (ref 0.5–1.9)

## 2019-09-04 LAB — LACTATE DEHYDROGENASE: LDH: 481 U/L — ABNORMAL HIGH (ref 98–192)

## 2019-09-04 LAB — C-REACTIVE PROTEIN: CRP: 2.4 mg/dL — ABNORMAL HIGH (ref <1.0)

## 2019-09-04 LAB — RESPIRATORY PANEL BY PCR

## 2019-09-04 LAB — D-DIMER, QUANTITATIVE: D-Dimer, Quant: 0.98 ug/mL-FEU — ABNORMAL HIGH (ref 0.00–0.50)

## 2019-09-04 LAB — GLUCOSE, CAPILLARY
Glucose-Capillary: 170 mg/dL — ABNORMAL HIGH (ref 70–99)
Glucose-Capillary: 319 mg/dL — ABNORMAL HIGH (ref 70–99)
Glucose-Capillary: 242 mg/dL — ABNORMAL HIGH (ref 70–99)
Glucose-Capillary: 195 mg/dL — ABNORMAL HIGH (ref 70–99)

## 2019-09-04 LAB — MAGNESIUM: Magnesium: 2.4 mg/dL (ref 1.7–2.4)

## 2019-09-04 LAB — FERRITIN: Ferritin: 392 ng/mL — ABNORMAL HIGH (ref 24–336)

## 2019-09-04 LAB — PHOSPHORUS: Phosphorus: 2.6 mg/dL (ref 2.5–4.6)

## 2019-09-04 MED ORDER — INSULIN DETEMIR 100 UNIT/ML ~~LOC~~ SOLN
14.0000 [IU] | Freq: Two times a day (BID) | SUBCUTANEOUS | Status: DC
  Administered 2019-09-04 – 2019-09-16 (×24): 14 [IU] via SUBCUTANEOUS
  Filled 2019-09-04 (×26): qty 0.14

## 2019-09-04 MED ORDER — SODIUM CHLORIDE 0.9 % IV BOLUS
1000.0000 mL | Freq: Once | INTRAVENOUS | Status: AC
  Administered 2019-09-04: 1000 mL via INTRAVENOUS

## 2019-09-04 MED ORDER — LORAZEPAM 1 MG PO TABS
1.0000 mg | ORAL_TABLET | Freq: Once | ORAL | Status: AC | PRN
  Administered 2019-09-04: 1 mg via ORAL
  Filled 2019-09-04: qty 1

## 2019-09-04 MED ORDER — GLUCERNA SHAKE PO LIQD
237.0000 mL | Freq: Two times a day (BID) | ORAL | Status: DC
  Administered 2019-09-04 – 2019-09-16 (×21): 237 mL via ORAL

## 2019-09-04 MED ORDER — INSULIN ASPART 100 UNIT/ML ~~LOC~~ SOLN
8.0000 [IU] | Freq: Three times a day (TID) | SUBCUTANEOUS | Status: DC
  Administered 2019-09-04 – 2019-09-11 (×20): 8 [IU] via SUBCUTANEOUS

## 2019-09-04 NOTE — Progress Notes (Signed)
CRITICAL VALUE ALERT  Critical Value:  Lactic Acid 2.9  Date & Time Notied:  09/04/2019; 16:54  Provider Notified: Dr. Joseph Art  Orders Received/Actions taken:

## 2019-09-04 NOTE — Progress Notes (Addendum)
PROGRESS NOTE    Thomas Silva  VHQ:[id number] DOB: Apr 06, 1965 DOA: 09/01/2019 PCP: [name], [name], MD     Brief Narrative:  Thomas Silva is a 54 y.o. WM PMHx HLD.  Pt with several day h/o SOB and cough.  Presented to PCP's office today.  Tested COVID positive.  Pt has not had vaccine.   ED Course: Tm 102.7, CXR with bilateral infiltrates C/W COVID.  Desats to 87% on RA, currently satting well but on 4L via [place].   Subjective: 8/25 afebrile A/O x4, positive acute S OB, states mildly improved but feels extremely wiped out, negative CP.  Negative nausea.  Negative vomiting.  Negative abdominal pain.   Assessment & Plan: Covid vaccination; no vaccination   Active Problems:   Acute hypoxemic respiratory failure due to COVID-19 [facility])  Acute respiratory failure with hypoxia/Covid 19 pneumonia COVID-19 Labs  Recent Labs    09/01/19 2202 09/01/19 2202 09/02/19 0406 09/03/19 0412 09/04/19 0405  DDIMER 1.73*   < > 1.55* 1.07* 0.98*  FERRITIN 503*  --   --   --  392*  LDH 528*  --   --   --  481*  CRP 5.4*   < > 5.6* 4.4* 2.4*   < > = values in this interval not displayed.    Lab Results  Component Value Date   SARSCOV2NAA POSITIVE (A) 09/01/2019   -Remdesivir -Decadron IV 10 mg daily -Positive history hepatitis B therefore not Candidate for Actemra -Albuterol QID -Titrate O2 to maintain SPO2> 88% -Flutter valve -Incentive spirometry -Thiamine 100 mg daily -Folic acid 1 mg daily -Zinc 220 mg daily -Prone patient 8 hours/day; if patient cannot tolerate prone 2 to 3 hours per shift  CAP -Continue empiric antibiotics 5 to 7-day course -8/4 sputum sample pending -8/4 respiratory viral panel pending -Lactic acid and procalcitonin pending  Hyperlipidemia --Crestor 10 mg daily  Hypothyroidism -Synthroid 25 mcg daily  DM type II uncontrolled with complication -8/4 hemoglobin A1c= 8.5 -8/5 increase Levemir 14 units BID -8/5 increase NovoLog 8 units qac   -Moderate SSI -Tradjenta 5 mg daily   AKI (baseline Cr 1.0) Recent Labs  Lab 09/01/19 1755 09/02/19 0406 09/03/19 0412 09/04/19 0405  CREATININE 1.28* 1.50* 1.04 0.84  -At baseline -Bolus 1 L normal saline  GERD -Continue with PPI   DVT prophylaxis: Lovenox Code Status: Full Family Communication:  Status is: Inpatient    Dispo: The patient is from: Home              Anticipated d/c is to: Home              Anticipated d/c date is: 8/9              Patient currently unstable      Consultants:    Procedures/Significant Events:    I have personally reviewed and interpreted all radiology studies and my findings are as above.  VENTILATOR SETTINGS: HF [place] 8/5 Flow; 14 L/min SPO2; 92%   Cultures   Antimicrobials: Anti-infectives (From admission, onward)   Start     Ordered Stop   09/03/19 1000  remdesivir 100 mg in sodium chloride 0.9 % 100 mL IVPB     Discontinue    "Followed by" Linked Group Details   09/01/19 2335 09/07/19 0959   09/02/19 2200  azithromycin (ZITHROMAX) 500 mg in sodium chloride 0.9 % 250 mL IVPB     Discontinue     09/02/19 0832 09/06/19 2159   09/02/19 2200  cefTRIAXone (ROCEPHIN) 1 g in sodium chloride 0.9 % 100 mL IVPB     Discontinue     09/02/19 0832     09/02/19 1600  valACYclovir (VALTREX) tablet 1,000 mg     Discontinue     09/02/19 1510     09/02/19 0100  remdesivir 200 mg in sodium chloride 0.9% 250 mL IVPB       "Followed by" Linked Group Details   09/01/19 2335 09/02/19 0119   09/01/19 2200  azithromycin (ZITHROMAX) 500 mg in sodium chloride 0.9 % 250 mL IVPB        09/01/19 2158 09/02/19 0033   09/01/19 2200  cefTRIAXone (ROCEPHIN) 1 g in sodium chloride 0.9 % 100 mL IVPB        09/01/19 2158 09/01/19 2306       Devices    LINES / TUBES:      Continuous Infusions: . azithromycin (ZITHROMAX) 500 MG IVPB (Vial-Mate Adaptor) Stopped (09/03/19 2302)  . cefTRIAXone (ROCEPHIN)  IV Stopped (09/03/19 2152)  .  remdesivir 100 mg in NS 100 mL 100 mg (09/04/19 8144)     Objective: Vitals:   09/03/19 0607 09/03/19 1245 09/03/19 2111 09/04/19 0507  BP:  118/76 126/69 131/68  Pulse:  74 76 71  Resp:  (!) 22 20 (!) 22  Temp:  98.3 F (36.8 C) 98.8 F (37.1 C) 98.3 F (36.8 C)  TempSrc:  Oral Oral Oral  SpO2: 90% 91% 90% 94%  Weight:      Height:        Intake/Output Summary (Last 24 hours) at 09/04/2019 1149 Last data filed at 09/04/2019 0935 Gross per 24 hour  Intake 1710 ml  Output 2500 ml  Net -790 ml   Filed Weights   09/01/19 1805  Weight: 108.9 kg    Examination:  General: Positive acute respiratory distress Eyes: negative scleral hemorrhage, negative anisocoria, negative icterus ENT: Negative Runny nose, negative gingival bleeding, Neck:  Negative scars, masses, torticollis, lymphadenopathy, JVD Lungs: tachypneic, diffuse poor air movement without wheezes or crackles Cardiovascular: Tachycardic, without murmur gallop or rub normal S1 and S2 Abdomen: negative abdominal pain, nondistended, positive soft, bowel sounds, no rebound, no ascites, no appreciable mass Extremities: No significant cyanosis, clubbing, or edema bilateral lower extremities Skin: Negative rashes, lesions, ulcers Psychiatric:  Negative depression, negative anxiety, negative fatigue, negative mania  Central nervous system:  Cranial nerves II through XII intact, tongue/uvula midline, all extremities muscle strength 5/5, sensation intact throughout,  negative dysarthria, negative expressive aphasia, negative receptive aphasia.  .     Data Reviewed: Care during the described time interval was provided by me .  I have reviewed this patient's available data, including medical history, events of note, physical examination, and all test results as part of my evaluation.  CBC: Recent Labs  Lab 09/01/19 1755 09/02/19 0406 09/03/19 0412 09/04/19 0405  WBC 5.7 4.4 6.5 8.3  NEUTROABS 4.6 3.8 5.5 7.5  HGB 18.5*  15.5 15.8 15.7  HCT 57.1* 49.9 49.4 48.4  MCV 88.4 91.4 90.3 88.8  PLT 157 127* 171 198   Basic Metabolic Panel: Recent Labs  Lab 09/01/19 1755 09/02/19 0406 09/03/19 0412 09/04/19 0405  NA 137 136 139 138  K 3.8 4.9 4.2 3.8  CL 98 99 104 102  CO2 24 19* 21* 25  GLUCOSE 230* 241* 190* 171*  BUN 15 24* 26* 20  CREATININE 1.28* 1.50* 1.04 0.84  CALCIUM 8.9 7.8* 8.1* 8.2*  MG  --   --   --  2.4  PHOS  --   --   --  2.6   GFR: Estimated Creatinine Clearance: 126.1 mL/min (by C-G formula based on SCr of 0.84 mg/dL). Liver Function Tests: Recent Labs  Lab 09/01/19 1755 09/02/19 0406 09/03/19 0412 09/04/19 0405  AST 120* 117* 78* 70*  ALT 78* 63* 55* 52*  ALKPHOS 74 57 47 51  BILITOT 1.0 2.1* 1.1 1.1  PROT 7.9 6.1* 5.9* 6.0*  ALBUMIN 3.5 2.8* 2.6* 2.6*   No results for input(s): LIPASE, AMYLASE in the last 168 hours. No results for input(s): AMMONIA in the last 168 hours. Coagulation Profile: No results for input(s): INR, PROTIME in the last 168 hours. Cardiac Enzymes: No results for input(s): CKTOTAL, CKMB, CKMBINDEX, TROPONINI in the last 168 hours. BNP (last 3 results) No results for input(s): PROBNP in the last 8760 hours. HbA1C: Recent Labs    09/03/19 0412  HGBA1C 8.5*   CBG: Recent Labs  Lab 09/03/19 1138 09/03/19 1617 09/03/19 1935 09/03/19 2116 09/04/19 0745  GLUCAP 231* 260* 261* 243* 170*   Lipid Profile: Recent Labs    09/01/19 2202  TRIG 198*   Thyroid Function Tests: No results for input(s): TSH, T4TOTAL, FREET4, T3FREE, THYROIDAB in the last 72 hours. Anemia Panel: Recent Labs    09/01/19 2202 09/04/19 0405  FERRITIN 503* 392*   Sepsis Labs: Recent Labs  Lab 09/01/19 1755 09/01/19 2202 09/01/19 2349 09/02/19 0406 09/03/19 0412  PROCALCITON  --  0.55  --  0.59 0.40  LATICACIDVEN 2.6*  --  1.7  --   --     Recent Results (from the past 240 hour(s))  Blood culture (routine x 2)     Status: None (Preliminary result)    Collection Time: 09/01/19  5:56 PM   Specimen: BLOOD  Result Value Ref Range Status   Specimen Description BLOOD RIGHT ANTECUBITAL  Final   Special Requests   Final    BOTTLES DRAWN AEROBIC AND ANAEROBIC Blood Culture adequate volume   Culture   Final    NO GROWTH 2 DAYS Performed at [facility], [address] [place], [place] [address]    Report Status PENDING  Incomplete  SARS Coronavirus 2 by RT PCR (hospital order, performed in [facility] hospital lab) Nasopharyngeal Nasopharyngeal Swab     Status: Abnormal   Collection Time: 09/01/19  9:58 PM   Specimen: Nasopharyngeal Swab  Result Value Ref Range Status   SARS Coronavirus 2 POSITIVE (A) NEGATIVE Final    Comment: RESULT CALLED TO, READ BACK BY AND VERIFIED WITH: J SAWAPZKI RN 09/01/19 2358 JDW (NOTE) SARS-CoV-2 target nucleic acids are DETECTED  SARS-CoV-2 RNA is generally detectable in upper respiratory specimens  during the acute phase of infection.  Positive results are indicative  of the presence of the identified virus, but do not rule out bacterial infection or co-infection with other pathogens not detected by the test.  Clinical correlation with patient history and  other diagnostic information is necessary to determine patient infection status.  The expected result is negative.  Fact Sheet for Patients:   BoilerBrush.com.cyhttps://www.fda.gov/media/136312/download   Fact Sheet for Healthcare Providers:   https://pope.com/https://www.fda.gov/media/136313/download    This test is not yet approved or cleared by the [place] FDA and  has been authorized for detection and/or diagnosis of SARS-CoV-2 by FDA under an Emergency Use Authorization (EUA).  This EUA will remain in effect (meaning this test ca n be used) for the duration of  the COVID-19 declaration  under Section 564(b)(1) of the Act, 21 U.S.C. section 360-bbb-3(b)(1), unless the authorization is terminated or revoked sooner.  Performed at [facility], [address], [place], [place] [address]   Blood culture (routine x 2)     Status: None (Preliminary result)   Collection Time: 09/01/19 10:02 PM   Specimen: BLOOD  Result Value Ref Range Status   Specimen Description BLOOD SITE NOT SPECIFIED  Final   Special Requests   Final    BOTTLES DRAWN AEROBIC AND ANAEROBIC Blood Culture adequate volume   Culture   Final    NO GROWTH 2 DAYS Performed at [facility], [address], [place], [place] [address]    Report Status PENDING  Incomplete  Respiratory Panel by PCR     Status: None   Collection Time: 09/03/19 11:16 PM   Specimen: Nasopharyngeal Swab; Respiratory  Result Value Ref Range Status   Adenovirus NOT DETECTED NOT DETECTED Final   Coronavirus 229E NOT DETECTED NOT DETECTED Final    Comment: (NOTE) The Coronavirus on the Respiratory Panel, DOES NOT test for the novel  Coronavirus (2019 nCoV)    Coronavirus HKU1 NOT DETECTED NOT DETECTED Final   Coronavirus NL63 NOT DETECTED NOT DETECTED Final   Coronavirus OC43 NOT DETECTED NOT DETECTED Final   Metapneumovirus NOT DETECTED NOT DETECTED Final   Rhinovirus / Enterovirus NOT DETECTED NOT DETECTED Final   Influenza A NOT DETECTED NOT DETECTED Final   Influenza B NOT DETECTED NOT DETECTED Final   Parainfluenza Virus 1 NOT DETECTED NOT DETECTED Final   Parainfluenza Virus 2 NOT DETECTED NOT DETECTED Final   Parainfluenza Virus 3 NOT DETECTED NOT DETECTED Final   Parainfluenza Virus 4 NOT DETECTED NOT DETECTED Final   Respiratory Syncytial Virus NOT DETECTED NOT DETECTED Final   Bordetella pertussis NOT DETECTED NOT DETECTED Final   Chlamydophila pneumoniae NOT DETECTED NOT DETECTED Final   Mycoplasma pneumoniae NOT DETECTED NOT DETECTED Final    Comment: Performed at [facility], [address] [place], [place] [address]         Radiology Studies: No results found.      Scheduled Meds: . AeroChamber Plus Flo-Vu Large  1 each Other Once  . albuterol  2 puff  Inhalation Q6H  . aspirin EC  81 mg Oral Daily  . dexamethasone (DECADRON) injection  10 mg Intravenous Daily  . enoxaparin (LOVENOX) injection  40 mg Subcutaneous Daily  . feeding supplement (GLUCERNA SHAKE)  237 mL Oral BID BM  . folic acid  1,000 mcg Oral Daily  . gabapentin  300 mg Oral Daily  . gabapentin  600 mg Oral QHS  . insulin aspart  0-15 Units Subcutaneous TID WC  . insulin aspart  0-5 Units Subcutaneous QHS  . insulin aspart  5 Units Subcutaneous TID WC  . insulin detemir  10 Units Subcutaneous BID  . levothyroxine  25 mcg Oral QAC breakfast  . linagliptin  5 mg Oral Daily  . lisinopril  2.5 mg Oral Daily  . pantoprazole  40 mg Oral Daily  . rosuvastatin  10 mg Oral QPM  . sodium chloride flush  3 mL Intravenous Once  . thiamine  100 mg Oral Daily  . valACYclovir  1,000 mg Oral Daily  . zinc sulfate  220 mg Oral Daily   Continuous Infusions: . azithromycin (ZITHROMAX) 500 MG IVPB (Vial-Mate Adaptor) Stopped (09/03/19 2302)  . cefTRIAXone (ROCEPHIN)  IV Stopped (09/03/19 2152)  . remdesivir  100 mg in NS 100 mL 100 mg (09/04/19 0928)     LOS: 3 days    Time spent:40 min    Kyliana Standen, Roselind Messier, MD [facility] Pager [phone]  If 7PM-7AM, please contact night-coverage [email or web address] Password [facility] 09/04/2019, 11:49 AM

## 2019-09-04 NOTE — Progress Notes (Signed)
Inpatient Diabetes Program Recommendations  AACE/ADA: New Consensus Statement on Inpatient Glycemic Control (2015)  Target Ranges:  Prepandial:   less than 140 mg/dL      Peak postprandial:   less than 180 mg/dL (1-2 hours)      Critically ill patients:  140 - 180 mg/dL   Lab Results  Component Value Date   GLUCAP 319 (H) 09/04/2019   HGBA1C 8.5 (H) 09/03/2019    Review of Glycemic Control Results for FAY, SWIDER (MRN [id number]) as of 09/04/2019 12:35  Ref. Range 09/04/2019 07:45 09/04/2019 11:53  Glucose-Capillary Latest Ref Range: 70 - 99 mg/dL 833 (H) 825 (H)    Inpatient Diabetes Program Recommendations:     Novolog 8 units TID with meals if eats at least 50% of meal.  Will continue to follow while inpatient.  Thank you, Dulce Sellar, RN, BSN Diabetes Coordinator Inpatient Diabetes Program [phone] (team pager from 8a-5p)

## 2019-09-04 NOTE — Plan of Care (Signed)
  Problem: Education: Goal: Knowledge of risk factors and measures for prevention of condition will improve Outcome: Progressing   Problem: Respiratory: Goal: Will maintain a patent airway Outcome: Progressing Goal: Complications related to the disease process, condition or treatment will be avoided or minimized Outcome: Progressing   Problem: Education: Goal: Knowledge of General Education information will improve Description: Including pain rating scale, medication(s)/side effects and non-pharmacologic comfort measures Outcome: Progressing   Problem: Activity: Goal: Risk for activity intolerance will decrease Outcome: Progressing   Problem: Nutrition: Goal: Adequate nutrition will be maintained Outcome: Progressing   Problem: Coping: Goal: Psychosocial and spiritual needs will be supported Outcome: Not Progressing

## 2019-09-05 DIAGNOSIS — E785 Hyperlipidemia, unspecified: Secondary | ICD-10-CM | POA: Diagnosis present

## 2019-09-05 DIAGNOSIS — E118 Type 2 diabetes mellitus with unspecified complications: Secondary | ICD-10-CM | POA: Diagnosis not present

## 2019-09-05 DIAGNOSIS — J9601 Acute respiratory failure with hypoxia: Secondary | ICD-10-CM | POA: Diagnosis not present

## 2019-09-05 DIAGNOSIS — U071 COVID-19: Secondary | ICD-10-CM | POA: Diagnosis not present

## 2019-09-05 DIAGNOSIS — E039 Hypothyroidism, unspecified: Secondary | ICD-10-CM | POA: Diagnosis present

## 2019-09-05 DIAGNOSIS — N179 Acute kidney failure, unspecified: Secondary | ICD-10-CM

## 2019-09-05 DIAGNOSIS — J189 Pneumonia, unspecified organism: Secondary | ICD-10-CM | POA: Diagnosis present

## 2019-09-05 LAB — MAGNESIUM: Magnesium: 2.3 mg/dL (ref 1.7–2.4)

## 2019-09-05 LAB — PHOSPHORUS: Phosphorus: 2.6 mg/dL (ref 2.5–4.6)

## 2019-09-05 LAB — CBC WITH DIFFERENTIAL/PLATELET
Abs Immature Granulocytes: 0.05 10*3/uL (ref 0.00–0.07)
Basophils Absolute: 0 10*3/uL (ref 0.0–0.1)
Basophils Relative: 0 %
Eosinophils Absolute: 0 10*3/uL (ref 0.0–0.5)
Eosinophils Relative: 0 %
HCT: 45.8 % (ref 39.0–52.0)
Hemoglobin: 14.6 g/dL (ref 13.0–17.0)
Immature Granulocytes: 1 %
Lymphocytes Relative: 6 %
Lymphs Abs: 0.4 10*3/uL — ABNORMAL LOW (ref 0.7–4.0)
MCH: 28.7 pg (ref 26.0–34.0)
MCHC: 31.9 g/dL (ref 30.0–36.0)
MCV: 90 fL (ref 80.0–100.0)
Monocytes Absolute: 0.3 10*3/uL (ref 0.1–1.0)
Monocytes Relative: 4 %
Neutro Abs: 6.9 10*3/uL (ref 1.7–7.7)
Neutrophils Relative %: 89 %
Platelets: 229 10*3/uL (ref 150–400)
RBC: 5.09 MIL/uL (ref 4.22–5.81)
RDW: 14 % (ref 11.5–15.5)
WBC: 7.7 10*3/uL (ref 4.0–10.5)
nRBC: 0 % (ref 0.0–0.2)

## 2019-09-05 LAB — COMPREHENSIVE METABOLIC PANEL
ALT: 46 U/L — ABNORMAL HIGH (ref 0–44)
AST: 60 U/L — ABNORMAL HIGH (ref 15–41)
Albumin: 2.4 g/dL — ABNORMAL LOW (ref 3.5–5.0)
Alkaline Phosphatase: 50 U/L (ref 38–126)
Anion gap: 11 (ref 5–15)
BUN: 16 mg/dL (ref 6–20)
CO2: 24 mmol/L (ref 22–32)
Calcium: 8 mg/dL — ABNORMAL LOW (ref 8.9–10.3)
Chloride: 104 mmol/L (ref 98–111)
Creatinine, Ser: 0.62 mg/dL (ref 0.61–1.24)
GFR calc Af Amer: >60 mL/min (ref >60)
GFR calc non Af Amer: >60 mL/min (ref >60)
Glucose, Bld: 143 mg/dL — ABNORMAL HIGH (ref 70–99)
Potassium: 3.9 mmol/L (ref 3.5–5.1)
Sodium: 139 mmol/L (ref 135–145)
Total Bilirubin: 0.8 mg/dL (ref 0.3–1.2)
Total Protein: 5.8 g/dL — ABNORMAL LOW (ref 6.5–8.1)

## 2019-09-05 LAB — GLUCOSE, CAPILLARY
Glucose-Capillary: 113 mg/dL — ABNORMAL HIGH (ref 70–99)
Glucose-Capillary: 235 mg/dL — ABNORMAL HIGH (ref 70–99)
Glucose-Capillary: 200 mg/dL — ABNORMAL HIGH (ref 70–99)
Glucose-Capillary: 224 mg/dL — ABNORMAL HIGH (ref 70–99)

## 2019-09-05 LAB — FERRITIN: Ferritin: 307 ng/mL (ref 24–336)

## 2019-09-05 LAB — PROCALCITONIN: Procalcitonin: <0.1 ng/mL

## 2019-09-05 LAB — C-REACTIVE PROTEIN: CRP: 4.2 mg/dL — ABNORMAL HIGH (ref <1.0)

## 2019-09-05 LAB — LACTATE DEHYDROGENASE: LDH: 487 U/L — ABNORMAL HIGH (ref 98–192)

## 2019-09-05 LAB — D-DIMER, QUANTITATIVE: D-Dimer, Quant: 1.13 ug/mL-FEU — ABNORMAL HIGH (ref 0.00–0.50)

## 2019-09-05 LAB — ABO/RH: ABO/RH(D): A NEG

## 2019-09-05 MED ORDER — SODIUM CHLORIDE 0.9% IV SOLUTION
Freq: Once | INTRAVENOUS | Status: AC
  Administered 2019-09-05: 1 mL via INTRAVENOUS

## 2019-09-05 MED ORDER — ALBUTEROL SULFATE HFA 108 (90 BASE) MCG/ACT IN AERS
2.0000 | INHALATION_SPRAY | Freq: Two times a day (BID) | RESPIRATORY_TRACT | Status: DC
  Administered 2019-09-05 – 2019-09-16 (×22): 2 via RESPIRATORY_TRACT
  Filled 2019-09-05: qty 6.7

## 2019-09-05 NOTE — Progress Notes (Signed)
PROGRESS NOTE    Thomas Silva  WPY:[id number] DOB: 04-Oct-1965 DOA: 09/01/2019 PCP: [name], [name], MD     Brief Narrative:  Thomas Silva is a 54 y.o. WM PMHx HLD.  Pt with several day h/o SOB and cough.  Presented to PCP's office today.  Tested COVID positive.  Pt has not had vaccine.   ED Course: Tm 102.7, CXR with bilateral infiltrates C/W COVID.  Desats to 87% on RA, currently satting well but on 4L via [place].   Subjective: 8/6 afebrile overnight A/O x4, positive acute S OB.  Patient perceives S OB/cough as worsening..  Negative CP, negative nausea, negative vomiting.  Patient does not qualify for Actemra secondary to history of hepatitis B, however has agreed to "convalescent plasma use.   Assessment & Plan: Covid vaccination; no vaccination   Active Problems:   Acute hypoxemic respiratory failure due to COVID-19 [facility])   Pneumonia due to COVID-19 virus   Community acquired pneumonia   HLD (hyperlipidemia)   Hypothyroidism   AKI (acute kidney injury) (HCC)  Acute respiratory failure with hypoxia/Covid 19 pneumonia COVID-19 Labs  Recent Labs    09/03/19 0412 09/04/19 0405 09/05/19 0406  DDIMER 1.07* 0.98* 1.13*  FERRITIN  --  392* 307  LDH  --  481* 487*  CRP 4.4* 2.4* 4.2*    Lab Results  Component Value Date   SARSCOV2NAA POSITIVE (A) 09/01/2019  -Remdesivir -Decadron IV 10 mg daily -Positive history hepatitis B therefore not Candidate for Actemra -Albuterol QID -Titrate O2 to maintain SPO2> 88% -Flutter valve -Incentive spirometry -Thiamine 100 mg daily -Folic acid 1 mg daily -Zinc 220 mg daily -Prone patient 8 hours/day; if patient cannot tolerate prone 2 to 3 hours per shift -Discussed with the patient that COVID-19 convalescent plasma (CCP) is not a proven therapy for COVID-19, randomized controlled trials have not shown evidence of benefits.  Counseled patient that he meet guidelines for the use of CCP secondary to the following;  Continued  shortness of breath (dyspnea)  Continued episodes of respiratory frequency ?30/min  Continued SPO2 ?93% -After discussion patient was provided Informed written consent using the Patient Consent for Convalescent Plasma use from [facility] IRB.  Both patient and I signed sheet. -8/5 transfuse 1 unit Covid Convalescent Plasma  CAP -Continue empiric antibiotics 5 to 7-day course -8/4 sputum sample pending -8/4 respiratory viral panel pending -Lactic acid and procalcitonin  Results for TREXTON, ESCAMILLA (MRN [id number]) as of 09/05/2019 14:05  Ref. Range 09/01/2019 17:55 09/01/2019 23:49 09/04/2019 16:02 09/04/2019 22:36  Lactic Acid, Venous Latest Ref Range: 0.5 - 1.9 mmol/L 2.6 (HH) 1.7 2.9 (HH) 1.9  Results for PARKER, WHERLEY (MRN [id number]) as of 09/05/2019 14:05  Ref. Range 09/01/2019 22:02 09/02/2019 04:06 09/03/2019 04:12 09/04/2019 16:02 09/05/2019 04:06  Procalcitonin Latest Units: ng/mL 0.55 0.59 0.40 <0.10 <0.10   Hyperlipidemia --Crestor 10 mg daily  Hypothyroidism -Synthroid 25 mcg daily  DM type II uncontrolled with complication -8/4 hemoglobin A1c= 8.5 -8/5 increase Levemir 14 units BID -8/5 increase NovoLog 8 units qac  -Moderate SSI -Tradjenta 5 mg daily   AKI (baseline Cr 1.0) Recent Labs  Lab 09/01/19 1755 09/02/19 0406 09/03/19 0412 09/04/19 0405 09/05/19 0406  CREATININE 1.28* 1.50* 1.04 0.84 0.62  -At baseline   GERD -Continue with PPI   DVT prophylaxis: Lovenox Code Status: Full Family Communication:  Status is: Inpatient    Dispo: The patient is from: Home              Anticipated  d/c is to: Home              Anticipated d/c date is: 8/16              Patient currently unstable      Consultants:    Procedures/Significant Events:  8/2 PCXR;Bilateral streaky densities most consistent with COVID-19 infiltrate.    I have personally reviewed and interpreted all radiology studies and my findings are as above.  VENTILATOR SETTINGS: HF [place] 8/6 Flow; 13  L/min SPO2; 95%    Cultures 8/2 SARS coronavirus positive 8/2 HIV screen negative 8/2 blood RIGHT AC NGTD 8/2 blood NGTD 8/4 virus panel negative   Antimicrobials: Anti-infectives (From admission, onward)   Start     Ordered Stop   09/03/19 1000  remdesivir 100 mg in sodium chloride 0.9 % 100 mL IVPB     Discontinue    "Followed by" Linked Group Details   09/01/19 2335 09/07/19 0959   09/02/19 2200  azithromycin (ZITHROMAX) 500 mg in sodium chloride 0.9 % 250 mL IVPB     Discontinue     09/02/19 0832 09/06/19 2159   09/02/19 2200  cefTRIAXone (ROCEPHIN) 1 g in sodium chloride 0.9 % 100 mL IVPB     Discontinue     09/02/19 0832     09/02/19 1600  valACYclovir (VALTREX) tablet 1,000 mg     Discontinue     09/02/19 1510     09/02/19 0100  remdesivir 200 mg in sodium chloride 0.9% 250 mL IVPB       "Followed by" Linked Group Details   09/01/19 2335 09/02/19 0119   09/01/19 2200  azithromycin (ZITHROMAX) 500 mg in sodium chloride 0.9 % 250 mL IVPB        09/01/19 2158 09/02/19 0033   09/01/19 2200  cefTRIAXone (ROCEPHIN) 1 g in sodium chloride 0.9 % 100 mL IVPB        09/01/19 2158 09/01/19 2306       Devices    LINES / TUBES:      Continuous Infusions: . azithromycin (ZITHROMAX) 500 MG IVPB (Vial-Mate Adaptor) Stopped (09/04/19 2307)  . cefTRIAXone (ROCEPHIN)  IV Stopped (09/05/19 0542)  . remdesivir 100 mg in NS 100 mL 100 mg (09/05/19 0921)     Objective: Vitals:   09/04/19 1319 09/04/19 2022 09/05/19 0450 09/05/19 1353  BP: 109/83 115/62 119/67 118/68  Pulse: 72 75 64 79  Resp: 20 (!) 21 20 (!) 22  Temp: 97.8 F (36.6 C) 98.2 F (36.8 C) 98 F (36.7 C) 98.3 F (36.8 C)  TempSrc: Axillary Oral Oral Oral  SpO2: 91% 90% 90% 91%  Weight:      Height:        Intake/Output Summary (Last 24 hours) at 09/05/2019 1416 Last data filed at 09/05/2019 1339 Gross per 24 hour  Intake 1818.42 ml  Output 1025 ml  Net 793.42 ml   Filed Weights   09/01/19 1805   Weight: 108.9 kg   Physical Exam:  General: A/O x4, positive acute respiratory distress Eyes: negative scleral hemorrhage, negative anisocoria, negative icterus ENT: Negative Runny nose, negative gingival bleeding, Neck:  Negative scars, masses, torticollis, lymphadenopathy, JVD Lungs: tachypneic, decreased to absent breath sounds to auscultation bilaterally without wheezes or crackles Cardiovascular: Tachycardic without murmur gallop or rub normal S1 and S2 Abdomen: negative abdominal pain, nondistended, positive soft, bowel sounds, no rebound, no ascites, no appreciable mass Extremities: No significant cyanosis, clubbing, or edema bilateral lower extremities Skin: Negative  rashes, lesions, ulcers Psychiatric:  Negative depression, negative anxiety, negative fatigue, negative mania  Central nervous system:  Cranial nerves II through XII intact, tongue/uvula midline, all extremities muscle strength 5/5, sensation intact throughout, negative dysarthria, negative expressive aphasia, negative receptive aphasia.  .     Data Reviewed: Care during the described time interval was provided by me .  I have reviewed this patient's available data, including medical history, events of note, physical examination, and all test results as part of my evaluation.  CBC: Recent Labs  Lab 09/01/19 1755 09/02/19 0406 09/03/19 0412 09/04/19 0405 09/05/19 0406  WBC 5.7 4.4 6.5 8.3 7.7  NEUTROABS 4.6 3.8 5.5 7.5 6.9  HGB 18.5* 15.5 15.8 15.7 14.6  HCT 57.1* 49.9 49.4 48.4 45.8  MCV 88.4 91.4 90.3 88.8 90.0  PLT 157 127* 171 198 229   Basic Metabolic Panel: Recent Labs  Lab 09/01/19 1755 09/02/19 0406 09/03/19 0412 09/04/19 0405 09/05/19 0406  NA 137 136 139 138 139  K 3.8 4.9 4.2 3.8 3.9  CL 98 99 104 102 104  CO2 24 19* 21* 25 24  GLUCOSE 230* 241* 190* 171* 143*  BUN 15 24* 26* 20 16  CREATININE 1.28* 1.50* 1.04 0.84 0.62  CALCIUM 8.9 7.8* 8.1* 8.2* 8.0*  MG  --   --   --  2.4 2.3   PHOS  --   --   --  2.6 2.6   GFR: Estimated Creatinine Clearance: 132.4 mL/min (by C-G formula based on SCr of 0.62 mg/dL). Liver Function Tests: Recent Labs  Lab 09/01/19 1755 09/02/19 0406 09/03/19 0412 09/04/19 0405 09/05/19 0406  AST 120* 117* 78* 70* 60*  ALT 78* 63* 55* 52* 46*  ALKPHOS 74 57 47 51 50  BILITOT 1.0 2.1* 1.1 1.1 0.8  PROT 7.9 6.1* 5.9* 6.0* 5.8*  ALBUMIN 3.5 2.8* 2.6* 2.6* 2.4*   No results for input(s): LIPASE, AMYLASE in the last 168 hours. No results for input(s): AMMONIA in the last 168 hours. Coagulation Profile: No results for input(s): INR, PROTIME in the last 168 hours. Cardiac Enzymes: No results for input(s): CKTOTAL, CKMB, CKMBINDEX, TROPONINI in the last 168 hours. BNP (last 3 results) No results for input(s): PROBNP in the last 8760 hours. HbA1C: Recent Labs    09/03/19 0412  HGBA1C 8.5*   CBG: Recent Labs  Lab 09/04/19 1153 09/04/19 1615 09/04/19 2104 09/05/19 0751 09/05/19 1200  GLUCAP 319* 242* 195* 113* 235*   Lipid Profile: No results for input(s): CHOL, HDL, LDLCALC, TRIG, CHOLHDL, LDLDIRECT in the last 72 hours. Thyroid Function Tests: No results for input(s): TSH, T4TOTAL, FREET4, T3FREE, THYROIDAB in the last 72 hours. Anemia Panel: Recent Labs    09/04/19 0405 09/05/19 0406  FERRITIN 392* 307   Sepsis Labs: Recent Labs  Lab 09/01/19 1755 09/01/19 2202 09/01/19 2349 09/02/19 0406 09/03/19 0412 09/04/19 1602 09/04/19 2236 09/05/19 0406  PROCALCITON  --    < >  --  0.59 0.40 <0.10  --  <0.10  LATICACIDVEN 2.6*  --  1.7  --   --  2.9* 1.9  --    < > = values in this interval not displayed.    Recent Results (from the past 240 hour(s))  Blood culture (routine x 2)     Status: None (Preliminary result)   Collection Time: 09/01/19  5:56 PM   Specimen: BLOOD  Result Value Ref Range Status   Specimen Description BLOOD RIGHT ANTECUBITAL  Final   Special Requests  Final    BOTTLES DRAWN AEROBIC AND  ANAEROBIC Blood Culture adequate volume   Culture   Final    NO GROWTH 3 DAYS Performed at [facility], [address] [place], [place] [address]    Report Status PENDING  Incomplete  SARS Coronavirus 2 by RT PCR (hospital order, performed in [facility] hospital lab) Nasopharyngeal Nasopharyngeal Swab     Status: Abnormal   Collection Time: 09/01/19  9:58 PM   Specimen: Nasopharyngeal Swab  Result Value Ref Range Status   SARS Coronavirus 2 POSITIVE (A) NEGATIVE Final    Comment: RESULT CALLED TO, READ BACK BY AND VERIFIED WITH: J SAWAPZKI RN 09/01/19 2358 JDW (NOTE) SARS-CoV-2 target nucleic acids are DETECTED  SARS-CoV-2 RNA is generally detectable in upper respiratory specimens  during the acute phase of infection.  Positive results are indicative  of the presence of the identified virus, but do not rule out bacterial infection or co-infection with other pathogens not detected by the test.  Clinical correlation with patient history and  other diagnostic information is necessary to determine patient infection status.  The expected result is negative.  Fact Sheet for Patients:   BoilerBrush.com.cy   Fact Sheet for Healthcare Providers:   https://pope.com/    This test is not yet approved or cleared by the [place] FDA and  has been authorized for detection and/or diagnosis of SARS-CoV-2 by FDA under an Emergency Use Authorization (EUA).  This EUA will remain in effect (meaning this test ca n be used) for the duration of  the COVID-19 declaration under Section 564(b)(1) of the Act, 21 U.S.C. section 360-bbb-3(b)(1), unless the authorization is terminated or revoked sooner.  Performed at [facility], [address] [place], [place] [address]   Blood culture (routine x 2)     Status: None (Preliminary result)   Collection Time: 09/01/19 10:02 PM   Specimen: BLOOD  Result Value Ref Range Status   Specimen  Description BLOOD SITE NOT SPECIFIED  Final   Special Requests   Final    BOTTLES DRAWN AEROBIC AND ANAEROBIC Blood Culture adequate volume   Culture   Final    NO GROWTH 3 DAYS Performed at [facility], [address] [place], [place] [address]    Report Status PENDING  Incomplete  Respiratory Panel by PCR     Status: None   Collection Time: 09/03/19 11:16 PM   Specimen: Nasopharyngeal Swab; Respiratory  Result Value Ref Range Status   Adenovirus NOT DETECTED NOT DETECTED Final   Coronavirus 229E NOT DETECTED NOT DETECTED Final    Comment: (NOTE) The Coronavirus on the Respiratory Panel, DOES NOT test for the novel  Coronavirus (2019 nCoV)    Coronavirus HKU1 NOT DETECTED NOT DETECTED Final   Coronavirus NL63 NOT DETECTED NOT DETECTED Final   Coronavirus OC43 NOT DETECTED NOT DETECTED Final   Metapneumovirus NOT DETECTED NOT DETECTED Final   Rhinovirus / Enterovirus NOT DETECTED NOT DETECTED Final   Influenza A NOT DETECTED NOT DETECTED Final   Influenza B NOT DETECTED NOT DETECTED Final   Parainfluenza Virus 1 NOT DETECTED NOT DETECTED Final   Parainfluenza Virus 2 NOT DETECTED NOT DETECTED Final   Parainfluenza Virus 3 NOT DETECTED NOT DETECTED Final   Parainfluenza Virus 4 NOT DETECTED NOT DETECTED Final   Respiratory Syncytial Virus NOT DETECTED NOT DETECTED Final   Bordetella pertussis NOT DETECTED NOT DETECTED Final   Chlamydophila pneumoniae NOT DETECTED NOT DETECTED Final  Mycoplasma pneumoniae NOT DETECTED NOT DETECTED Final    Comment: Performed at [facility], [address] [place], [place] [address]         Radiology Studies: No results found.      Scheduled Meds: . sodium chloride   Intravenous Once  . AeroChamber Plus Flo-Vu Large  1 each Other Once  . albuterol  2 puff Inhalation Q6H  . aspirin EC  81 mg Oral Daily  . dexamethasone (DECADRON) injection  10 mg Intravenous Daily  . enoxaparin (LOVENOX) injection  40 mg Subcutaneous  Daily  . feeding supplement (GLUCERNA SHAKE)  237 mL Oral BID BM  . folic acid  1,000 mcg Oral Daily  . gabapentin  300 mg Oral Daily  . gabapentin  600 mg Oral QHS  . insulin aspart  0-15 Units Subcutaneous TID WC  . insulin aspart  0-5 Units Subcutaneous QHS  . insulin aspart  8 Units Subcutaneous TID WC  . insulin detemir  14 Units Subcutaneous BID  . levothyroxine  25 mcg Oral QAC breakfast  . linagliptin  5 mg Oral Daily  . lisinopril  2.5 mg Oral Daily  . pantoprazole  40 mg Oral Daily  . rosuvastatin  10 mg Oral QPM  . sodium chloride flush  3 mL Intravenous Once  . thiamine  100 mg Oral Daily  . valACYclovir  1,000 mg Oral Daily  . zinc sulfate  220 mg Oral Daily   Continuous Infusions: . azithromycin (ZITHROMAX) 500 MG IVPB (Vial-Mate [employer]or) Stopped (09/04/19 2307)  . cefTRIAXone (ROCEPHIN)  IV Stopped (09/05/19 0542)  . remdesivir 100 mg in NS 100 mL 100 mg (09/05/19 0921)     LOS: 4 days    Time spent:40 min    York Valliant, Roselind MessierURTIS J, MD [facility] Pager [phone]  If 7PM-7AM, please contact night-coverage [email or web address] Password [email or web address] 09/05/2019, 2:16 PM

## 2019-09-06 DIAGNOSIS — J9601 Acute respiratory failure with hypoxia: Secondary | ICD-10-CM | POA: Diagnosis not present

## 2019-09-06 DIAGNOSIS — E118 Type 2 diabetes mellitus with unspecified complications: Secondary | ICD-10-CM | POA: Diagnosis not present

## 2019-09-06 DIAGNOSIS — N179 Acute kidney failure, unspecified: Secondary | ICD-10-CM | POA: Diagnosis not present

## 2019-09-06 DIAGNOSIS — U071 COVID-19: Secondary | ICD-10-CM | POA: Diagnosis not present

## 2019-09-06 LAB — COMPREHENSIVE METABOLIC PANEL
ALT: 46 U/L — ABNORMAL HIGH (ref 0–44)
AST: 53 U/L — ABNORMAL HIGH (ref 15–41)
Albumin: 2.8 g/dL — ABNORMAL LOW (ref 3.5–5.0)
Alkaline Phosphatase: 53 U/L (ref 38–126)
Anion gap: 12 (ref 5–15)
BUN: 18 mg/dL (ref 6–20)
CO2: 24 mmol/L (ref 22–32)
Calcium: 8.5 mg/dL — ABNORMAL LOW (ref 8.9–10.3)
Chloride: 102 mmol/L (ref 98–111)
Creatinine, Ser: 0.72 mg/dL (ref 0.61–1.24)
GFR calc Af Amer: >60 mL/min (ref >60)
GFR calc non Af Amer: >60 mL/min (ref >60)
Glucose, Bld: 81 mg/dL (ref 70–99)
Potassium: 3.9 mmol/L (ref 3.5–5.1)
Sodium: 138 mmol/L (ref 135–145)
Total Bilirubin: 0.8 mg/dL (ref 0.3–1.2)
Total Protein: 6.4 g/dL — ABNORMAL LOW (ref 6.5–8.1)

## 2019-09-06 LAB — C-REACTIVE PROTEIN: CRP: 3.9 mg/dL — ABNORMAL HIGH (ref <1.0)

## 2019-09-06 LAB — CBC WITH DIFFERENTIAL/PLATELET
Abs Immature Granulocytes: 0.09 10*3/uL — ABNORMAL HIGH (ref 0.00–0.07)
Basophils Absolute: 0 10*3/uL (ref 0.0–0.1)
Basophils Relative: 0 %
Eosinophils Absolute: 0 10*3/uL (ref 0.0–0.5)
Eosinophils Relative: 0 %
HCT: 48.2 % (ref 39.0–52.0)
Hemoglobin: 15 g/dL (ref 13.0–17.0)
Immature Granulocytes: 1 %
Lymphocytes Relative: 6 %
Lymphs Abs: 0.5 10*3/uL — ABNORMAL LOW (ref 0.7–4.0)
MCH: 28.2 pg (ref 26.0–34.0)
MCHC: 31.1 g/dL (ref 30.0–36.0)
MCV: 90.6 fL (ref 80.0–100.0)
Monocytes Absolute: 0.2 10*3/uL (ref 0.1–1.0)
Monocytes Relative: 3 %
Neutro Abs: 7.3 10*3/uL (ref 1.7–7.7)
Neutrophils Relative %: 90 %
Platelets: 277 10*3/uL (ref 150–400)
RBC: 5.32 MIL/uL (ref 4.22–5.81)
RDW: 13.9 % (ref 11.5–15.5)
WBC: 8.2 10*3/uL (ref 4.0–10.5)
nRBC: 0 % (ref 0.0–0.2)

## 2019-09-06 LAB — CULTURE, BLOOD (ROUTINE X 2)
Culture: NO GROWTH
Culture: NO GROWTH
Special Requests: ADEQUATE
Special Requests: ADEQUATE

## 2019-09-06 LAB — GLUCOSE, CAPILLARY
Glucose-Capillary: 62 mg/dL — ABNORMAL LOW (ref 70–99)
Glucose-Capillary: 73 mg/dL (ref 70–99)
Glucose-Capillary: 208 mg/dL — ABNORMAL HIGH (ref 70–99)
Glucose-Capillary: 235 mg/dL — ABNORMAL HIGH (ref 70–99)
Glucose-Capillary: 246 mg/dL — ABNORMAL HIGH (ref 70–99)

## 2019-09-06 LAB — MAGNESIUM: Magnesium: 2.2 mg/dL (ref 1.7–2.4)

## 2019-09-06 LAB — D-DIMER, QUANTITATIVE: D-Dimer, Quant: 2.06 ug/mL-FEU — ABNORMAL HIGH (ref 0.00–0.50)

## 2019-09-06 LAB — PROCALCITONIN: Procalcitonin: <0.1 ng/mL

## 2019-09-06 LAB — LACTATE DEHYDROGENASE: LDH: 526 U/L — ABNORMAL HIGH (ref 98–192)

## 2019-09-06 LAB — PHOSPHORUS: Phosphorus: 2.9 mg/dL (ref 2.5–4.6)

## 2019-09-06 LAB — FERRITIN: Ferritin: 266 ng/mL (ref 24–336)

## 2019-09-06 NOTE — Progress Notes (Signed)
Hypoglycemic Event  CBG: 62  Treatment: 4 oz juice/soda  Symptoms: None  Follow-up CBG: Time: 0833 CBG Result: 73  Possible Reasons for Event: Inadequate meal intake   Lyndal Pulley

## 2019-09-06 NOTE — Progress Notes (Signed)
PROGRESS NOTE    Thomas Silva  NOB:[id number] DOB: October 15, 1965 DOA: 09/01/2019 PCP: [name], [name], MD     Brief Narrative:  Thomas Silva is a 54 y.o. WM PMHx HLD.  Pt with several day h/o SOB and cough.  Presented to PCP's office today.  Tested COVID positive.  Pt has not had vaccine.   ED Course: Tm 102.7, CXR with bilateral infiltrates C/W COVID.  Desats to 87% on RA, currently satting well but on 4L via [place].   Subjective: 8/7 afebrile overnight A/O x4, positive acute S OB but improving.  Negative nausea, negative vomiting.  Feels that after receiving Covid convalescent plasma his breathing has improved.   Assessment & Plan: Covid vaccination; no vaccination   Active Problems:   Acute hypoxemic respiratory failure due to COVID-19 [facility])   Pneumonia due to COVID-19 virus   Community acquired pneumonia   HLD (hyperlipidemia)   Hypothyroidism   AKI (acute kidney injury) (HCC)  Acute respiratory failure with hypoxia/Covid 19 pneumonia COVID-19 Labs  Recent Labs    09/04/19 0405 09/05/19 0406 09/06/19 0751  DDIMER 0.98* 1.13* 2.06*  FERRITIN 392* 307 266  LDH 481* 487* 526*  CRP 2.4* 4.2* 3.9*    Lab Results  Component Value Date   SARSCOV2NAA POSITIVE (A) 09/01/2019  -Remdesivir -Decadron IV 10 mg daily -Positive history hepatitis B therefore not Candidate for Actemra -Albuterol QID -Titrate O2 to maintain SPO2> 88% -Flutter valve -Incentive spirometry -Thiamine 100 mg daily -Folic acid 1 mg daily -Zinc 220 mg daily -Prone patient 8 hours/day; if patient cannot tolerate prone 2 to 3 hours per shift -Discussed with the patient that COVID-19 convalescent plasma (CCP) is not a proven therapy for COVID-19, randomized controlled trials have not shown evidence of benefits.  Counseled patient that he meet guidelines for the use of CCP secondary to the following;  Continued shortness of breath (dyspnea)  Continued episodes of respiratory frequency  ?30/min  Continued SPO2 ?93% -After discussion patient was provided Informed written consent using the Patient Consent for Convalescent Plasma use from [facility] IRB.  Both patient and I signed sheet. -8/5 transfuse 1 unit Covid Convalescent Plasma  CAP -Continue empiric antibiotics 5 to 7-day course -8/4 sputum sample pending -8/4 respiratory viral panel pending -Lactic acid and procalcitonin  Results for [name], Thomas Silva (MRN [id number]) as of 09/05/2019 14:05  Ref. Range 09/01/2019 17:55 09/01/2019 23:49 09/04/2019 16:02 09/04/2019 22:36  Lactic Acid, Venous Latest Ref Range: 0.5 - 1.9 mmol/L 2.6 (HH) 1.7 2.9 (HH) 1.9  Results for Silva, Thomas (MRN [id number]) as of 09/05/2019 14:05  Ref. Range 09/01/2019 22:02 09/02/2019 04:06 09/03/2019 04:12 09/04/2019 16:02 09/05/2019 04:06  Procalcitonin Latest Units: ng/mL 0.55 0.59 0.40 <0.10 <0.10   Hyperlipidemia --Crestor 10 mg daily  Hypothyroidism -Synthroid 25 mcg daily  DM type II uncontrolled with complication -8/4 hemoglobin A1c= 8.5 -8/5 increase Levemir 14 units BID -8/5 increase NovoLog 8 units qac  -Moderate SSI -Tradjenta 5 mg daily   AKI (baseline Cr 1.0) Recent Labs  Lab 09/02/19 0406 09/03/19 0412 09/04/19 0405 09/05/19 0406 09/06/19 0751  CREATININE 1.50* 1.04 0.84 0.62 0.72  -At baseline   GERD -Continue with PPI   DVT prophylaxis: Lovenox Code Status: Full Family Communication:  Status is: Inpatient    Dispo: The patient is from: Home              Anticipated d/c is to: Home              Anticipated d/c  date is: 8/16              Patient currently unstable      Consultants:    Procedures/Significant Events:  8/2 PCXR;Bilateral streaky densities most consistent with COVID-19 infiltrate.    I have personally reviewed and interpreted all radiology studies and my findings are as above.  VENTILATOR SETTINGS: HFNC 8/7 Flow; 8 L/min SPO2; 90%    Cultures 8/2 SARS coronavirus positive 8/2 HIV screen  negative 8/2 blood RIGHT AC NGTD 8/2 blood NGTD 8/4 virus panel negative   Antimicrobials: Anti-infectives (From admission, onward)   Start     Ordered Stop   09/03/19 1000  remdesivir 100 mg in sodium chloride 0.9 % 100 mL IVPB     Discontinue    "Followed by" Linked Group Details   09/01/19 2335 09/07/19 0959   09/02/19 2200  azithromycin (ZITHROMAX) 500 mg in sodium chloride 0.9 % 250 mL IVPB     Discontinue     09/02/19 0832 09/06/19 2159   09/02/19 2200  cefTRIAXone (ROCEPHIN) 1 g in sodium chloride 0.9 % 100 mL IVPB     Discontinue     09/02/19 0832     09/02/19 1600  valACYclovir (VALTREX) tablet 1,000 mg     Discontinue     09/02/19 1510     09/02/19 0100  remdesivir 200 mg in sodium chloride 0.9% 250 mL IVPB       "Followed by" Linked Group Details   09/01/19 2335 09/02/19 0119   09/01/19 2200  azithromycin (ZITHROMAX) 500 mg in sodium chloride 0.9 % 250 mL IVPB        09/01/19 2158 09/02/19 0033   09/01/19 2200  cefTRIAXone (ROCEPHIN) 1 g in sodium chloride 0.9 % 100 mL IVPB        09/01/19 2158 09/01/19 2306       Devices    LINES / TUBES:      Continuous Infusions: . cefTRIAXone (ROCEPHIN)  IV 1 g (09/05/19 2207)     Objective: Vitals:   09/06/19 0546 09/06/19 0549 09/06/19 0836 09/06/19 0850  BP: 119/72 113/74 (!) 147/60   Pulse:  (!) 58 79   Resp: (!) 26 (!) 22 (!) 21 (!) 21  Temp: 98.4 F (36.9 C) 98.4 F (36.9 C) 98.5 F (36.9 C)   TempSrc: Oral Oral Oral   SpO2:  90% (!) 86%   Weight:      Height:        Intake/Output Summary (Last 24 hours) at 09/06/2019 1254 Last data filed at 09/06/2019 1100 Gross per 24 hour  Intake 787 ml  Output 950 ml  Net -163 ml   Filed Weights   09/01/19 1805 09/05/19 1938  Weight: 108.9 kg 108.9 kg   Physical Exam:  General: A/O x4, positive acute respiratory distress Eyes: negative scleral hemorrhage, negative anisocoria, negative icterus ENT: Negative Runny nose, negative gingival bleeding, Neck:   Negative scars, masses, torticollis, lymphadenopathy, JVD Lungs: decreased breath sounds, but now air movement in all lung fields, bilaterally, positive expiratory wheezes, negative crackles Cardiovascular: Regular rate and rhythm without murmur gallop or rub normal S1 and S2 Abdomen: negative abdominal pain, nondistended, positive soft, bowel sounds, no rebound, no ascites, no appreciable mass Extremities: No significant cyanosis, clubbing, or edema bilateral lower extremities Skin: Negative rashes, lesions, ulcers Psychiatric:  Negative depression, negative anxiety, negative fatigue, negative mania  Central nervous system:  Cranial nerves II through XII intact, tongue/uvula midline, all extremities muscle strength 5/5,  sensation intact throughout, negative dysarthria, negative expressive aphasia, negative receptive aphasia.  .     Data Reviewed: Care during the described time interval was provided by me .  I have reviewed this patient's available data, including medical history, events of note, physical examination, and all test results as part of my evaluation.  CBC: Recent Labs  Lab 09/02/19 0406 09/03/19 0412 09/04/19 0405 09/05/19 0406 09/06/19 0751  WBC 4.4 6.5 8.3 7.7 8.2  NEUTROABS 3.8 5.5 7.5 6.9 7.3  HGB 15.5 15.8 15.7 14.6 15.0  HCT 49.9 49.4 48.4 45.8 48.2  MCV 91.4 90.3 88.8 90.0 90.6  PLT 127* 171 198 229 277   Basic Metabolic Panel: Recent Labs  Lab 09/02/19 0406 09/03/19 0412 09/04/19 0405 09/05/19 0406 09/06/19 0751  NA 136 139 138 139 138  K 4.9 4.2 3.8 3.9 3.9  CL 99 104 102 104 102  CO2 19* 21* GLUCOSE 241* 190* 171* 143* 81  BUN 24* 26* CREATININE 1.50* 1.04 0.84 0.62 0.72  CALCIUM 7.8* 8.1* 8.2* 8.0* 8.5*  MG  --   --  2.4 2.3 2.2  PHOS  --   --  2.6 2.6 2.9   GFR: Estimated Creatinine Clearance: 132.4 mL/min (by C-G formula based on SCr of 0.72 mg/dL). Liver Function Tests: Recent Labs  Lab 09/02/19 0406 09/03/19 0412  09/04/19 0405 09/05/19 0406 09/06/19 0751  AST 117* 78* 70* 60* 53*  ALT 63* 55* 52* 46* 46*  ALKPHOS 57 47 51 50 53  BILITOT 2.1* 1.1 1.1 0.8 0.8  PROT 6.1* 5.9* 6.0* 5.8* 6.4*  ALBUMIN 2.8* 2.6* 2.6* 2.4* 2.8*   No results for input(s): LIPASE, AMYLASE in the last 168 hours. No results for input(s): AMMONIA in the last 168 hours. Coagulation Profile: No results for input(s): INR, PROTIME in the last 168 hours. Cardiac Enzymes: No results for input(s): CKTOTAL, CKMB, CKMBINDEX, TROPONINI in the last 168 hours. BNP (last 3 results) No results for input(s): PROBNP in the last 8760 hours. HbA1C: No results for input(s): HGBA1C in the last 72 hours. CBG: Recent Labs  Lab 09/05/19 1710 09/05/19 2156 09/06/19 0806 09/06/19 0833 09/06/19 1148  GLUCAP 200* 224* 62* 73 208*   Lipid Profile: No results for input(s): CHOL, HDL, LDLCALC, TRIG, CHOLHDL, LDLDIRECT in the last 72 hours. Thyroid Function Tests: No results for input(s): TSH, T4TOTAL, FREET4, T3FREE, THYROIDAB in the last 72 hours. Anemia Panel: Recent Labs    09/05/19 0406 09/06/19 0751  FERRITIN 307 266   Sepsis Labs: Recent Labs  Lab 09/01/19 1755 09/01/19 2202 09/01/19 2349 09/02/19 0406 09/03/19 0412 09/04/19 1602 09/04/19 2236 09/05/19 0406 09/06/19 0751  PROCALCITON  --    < >  --    < > 0.40 <0.10  --  <0.10 <0.10  LATICACIDVEN 2.6*  --  1.7  --   --  2.9* 1.9  --   --    < > = values in this interval not displayed.    Recent Results (from the past 240 hour(s))  Blood culture (routine x 2)     Status: None   Collection Time: 09/01/19  5:56 PM   Specimen: BLOOD  Result Value Ref Range Status   Specimen Description BLOOD RIGHT ANTECUBITAL  Final   Special Requests   Final    BOTTLES DRAWN AEROBIC AND ANAEROBIC Blood Culture adequate volume   Culture   Final    NO GROWTH 5 DAYS Performed at  [facility], [address] [place], [place] [address]    Report Status 09/06/2019 FINAL   Final  SARS Coronavirus 2 by RT PCR (hospital order, performed in [facility]) Nasopharyngeal Nasopharyngeal Swab  [facility]   Status: Abnormal   Collection Time: 09/01/19  9:58 PM   Specimen: Nasopharyngeal Swab  Result Value Ref Range Status   SARS Coronavirus 2 POSITIVE (A) NEGATIVE Final    Comment: RESULT CALLED TO, READ BACK BY AND VERIFIED WITH: J SAWAPZKI RN 09/01/19 2358 JDW (NOTE) SARS-CoV-2 target nucleic acids are DETECTED  SARS-CoV-2 RNA is generally detectable in upper respiratory specimens  during the acute phase of infection.  Positive results are indicative  of the presence of the identified virus, but do not rule out bacterial infection or co-infection with other pathogens not detected by the test.  Clinical correlation with patient history and  other diagnostic information is necessary to determine patient infection status.  The expected result is negative.  Fact Sheet for Patients:   BoilerBrush.com.cyhttps://www.fda.gov/media/136312/download   Fact Sheet for Healthcare Providers:   https://pope.com/https://www.fda.gov/media/136313/download    This test is not yet approved or cleared by the [place] FDA and  has been authorized for detection and/or diagnosis of SARS-CoV-2 by FDA under an Emergency Use Authorization (EUA).  This EUA will remain in effect (meaning this test ca n be used) for the duration of  the COVID-19 declaration under Section 564(b)(1) of the Act, 21 U.S.C. section 360-bbb-3(b)(1), unless the authorization is terminated or revoked sooner.  Performed at [facility], [address] [place], [place] [address]   Blood culture (routine x 2)     Status: None   Collection Time: 09/01/19 10:02 PM   Specimen: BLOOD  Result Value Ref Range Status   Specimen Description BLOOD SITE NOT SPECIFIED  Final   Special Requests   Final    BOTTLES DRAWN AEROBIC AND ANAEROBIC Blood Culture adequate volume   Culture   Final    NO GROWTH 5 DAYS Performed at [facility], [address] [place], [place] [address]    Report Status 09/06/2019 FINAL  Final  Respiratory Panel by PCR     Status: None   Collection Time: 09/03/19 11:16 PM   Specimen: Nasopharyngeal Swab; Respiratory  Result Value Ref Range Status   Adenovirus NOT DETECTED NOT DETECTED Final   Coronavirus 229E NOT DETECTED NOT DETECTED Final    Comment: (NOTE) The Coronavirus on the Respiratory Panel, DOES NOT test for the novel  Coronavirus (2019 nCoV)    Coronavirus HKU1 NOT DETECTED NOT DETECTED Final   Coronavirus NL63 NOT DETECTED NOT DETECTED Final   Coronavirus OC43 NOT DETECTED NOT DETECTED Final   Metapneumovirus NOT DETECTED NOT DETECTED Final   Rhinovirus / Enterovirus NOT DETECTED NOT DETECTED Final   Influenza A NOT DETECTED NOT DETECTED Final   Influenza B NOT DETECTED NOT DETECTED Final   Parainfluenza Virus 1 NOT DETECTED NOT DETECTED Final   Parainfluenza Virus 2 NOT DETECTED NOT DETECTED Final   Parainfluenza Virus 3 NOT DETECTED NOT DETECTED Final   Parainfluenza Virus 4 NOT DETECTED NOT DETECTED Final   Respiratory Syncytial Virus NOT DETECTED NOT DETECTED Final   Bordetella pertussis NOT DETECTED NOT DETECTED Final   Chlamydophila pneumoniae NOT DETECTED NOT DETECTED Final   Mycoplasma pneumoniae NOT DETECTED NOT DETECTED Final    Comment: Performed at [facility], [address] [place], [place] [address]  Radiology Studies: No results found.      Scheduled Meds: . AeroChamber Plus Flo-Vu Large  1 each Other Once  . albuterol  2 puff Inhalation BID  . aspirin EC  81 mg Oral Daily  . dexamethasone (DECADRON) injection  10 mg Intravenous Daily  . enoxaparin (LOVENOX) injection  40 mg Subcutaneous Daily  . feeding supplement (GLUCERNA SHAKE)  237 mL Oral BID BM  . folic acid  1,000 mcg Oral Daily  . gabapentin  300 mg Oral Daily  . gabapentin  600 mg Oral QHS  . insulin aspart  0-15 Units Subcutaneous TID WC  . insulin aspart   0-5 Units Subcutaneous QHS  . insulin aspart  8 Units Subcutaneous TID WC  . insulin detemir  14 Units Subcutaneous BID  . levothyroxine  25 mcg Oral QAC breakfast  . linagliptin  5 mg Oral Daily  . lisinopril  2.5 mg Oral Daily  . pantoprazole  40 mg Oral Daily  . rosuvastatin  10 mg Oral QPM  . sodium chloride flush  3 mL Intravenous Once  . thiamine  100 mg Oral Daily  . valACYclovir  1,000 mg Oral Daily  . zinc sulfate  220 mg Oral Daily   Continuous Infusions: . cefTRIAXone (ROCEPHIN)  IV 1 g (09/05/19 2207)     LOS: 5 days    Time spent:40 min    Kadeisha Betsch, Roselind Messier, MD [facility] Pager [phone]  If 7PM-7AM, please contact night-coverage [email or web address] Password [facility] 09/06/2019, 12:54 PM

## 2019-09-07 ENCOUNTER — Inpatient Hospital Stay (HOSPITAL_COMMUNITY): Payer: Commercial Managed Care - PPO

## 2019-09-07 DIAGNOSIS — E118 Type 2 diabetes mellitus with unspecified complications: Secondary | ICD-10-CM | POA: Diagnosis not present

## 2019-09-07 DIAGNOSIS — J9601 Acute respiratory failure with hypoxia: Secondary | ICD-10-CM | POA: Diagnosis not present

## 2019-09-07 DIAGNOSIS — U071 COVID-19: Secondary | ICD-10-CM | POA: Diagnosis not present

## 2019-09-07 DIAGNOSIS — N179 Acute kidney failure, unspecified: Secondary | ICD-10-CM | POA: Diagnosis not present

## 2019-09-07 LAB — BPAM FFP
Blood Product Expiration Date: 202108071835
ISSUE DATE / TIME: 202108061931
Unit Type and Rh: 6200

## 2019-09-07 LAB — CBC WITH DIFFERENTIAL/PLATELET
Abs Immature Granulocytes: 0.12 10*3/uL — ABNORMAL HIGH (ref 0.00–0.07)
Basophils Absolute: 0 10*3/uL (ref 0.0–0.1)
Basophils Relative: 0 %
Eosinophils Absolute: 0.1 10*3/uL (ref 0.0–0.5)
Eosinophils Relative: 1 %
HCT: 46.9 % (ref 39.0–52.0)
Hemoglobin: 14.7 g/dL (ref 13.0–17.0)
Immature Granulocytes: 2 %
Lymphocytes Relative: 7 %
Lymphs Abs: 0.5 10*3/uL — ABNORMAL LOW (ref 0.7–4.0)
MCH: 28.3 pg (ref 26.0–34.0)
MCHC: 31.3 g/dL (ref 30.0–36.0)
MCV: 90.2 fL (ref 80.0–100.0)
Monocytes Absolute: 0.3 10*3/uL (ref 0.1–1.0)
Monocytes Relative: 4 %
Neutro Abs: 6.5 10*3/uL (ref 1.7–7.7)
Neutrophils Relative %: 86 %
Platelets: 343 10*3/uL (ref 150–400)
RBC: 5.2 MIL/uL (ref 4.22–5.81)
RDW: 14 % (ref 11.5–15.5)
WBC: 7.5 10*3/uL (ref 4.0–10.5)
nRBC: 0 % (ref 0.0–0.2)

## 2019-09-07 LAB — PREPARE FRESH FROZEN PLASMA: Unit division: 0

## 2019-09-07 LAB — GLUCOSE, CAPILLARY
Glucose-Capillary: 89 mg/dL (ref 70–99)
Glucose-Capillary: 223 mg/dL — ABNORMAL HIGH (ref 70–99)
Glucose-Capillary: 260 mg/dL — ABNORMAL HIGH (ref 70–99)
Glucose-Capillary: 222 mg/dL — ABNORMAL HIGH (ref 70–99)

## 2019-09-07 LAB — COMPREHENSIVE METABOLIC PANEL
ALT: 46 U/L — ABNORMAL HIGH (ref 0–44)
AST: 40 U/L (ref 15–41)
Albumin: 2.5 g/dL — ABNORMAL LOW (ref 3.5–5.0)
Alkaline Phosphatase: 53 U/L (ref 38–126)
Anion gap: 14 (ref 5–15)
BUN: 18 mg/dL (ref 6–20)
CO2: 24 mmol/L (ref 22–32)
Calcium: 8.6 mg/dL — ABNORMAL LOW (ref 8.9–10.3)
Chloride: 101 mmol/L (ref 98–111)
Creatinine, Ser: 0.69 mg/dL (ref 0.61–1.24)
GFR calc Af Amer: >60 mL/min (ref >60)
GFR calc non Af Amer: >60 mL/min (ref >60)
Glucose, Bld: 99 mg/dL (ref 70–99)
Potassium: 4 mmol/L (ref 3.5–5.1)
Sodium: 139 mmol/L (ref 135–145)
Total Bilirubin: 0.9 mg/dL (ref 0.3–1.2)
Total Protein: 6.2 g/dL — ABNORMAL LOW (ref 6.5–8.1)

## 2019-09-07 LAB — MAGNESIUM: Magnesium: 2.1 mg/dL (ref 1.7–2.4)

## 2019-09-07 LAB — PHOSPHORUS: Phosphorus: 3.2 mg/dL (ref 2.5–4.6)

## 2019-09-07 LAB — C-REACTIVE PROTEIN: CRP: 7.9 mg/dL — ABNORMAL HIGH (ref <1.0)

## 2019-09-07 LAB — FERRITIN: Ferritin: 307 ng/mL (ref 24–336)

## 2019-09-07 LAB — LACTATE DEHYDROGENASE: LDH: 352 U/L — ABNORMAL HIGH (ref 98–192)

## 2019-09-07 LAB — D-DIMER, QUANTITATIVE: D-Dimer, Quant: 2.06 ug/mL-FEU — ABNORMAL HIGH (ref 0.00–0.50)

## 2019-09-07 MED ORDER — IOHEXOL 350 MG/ML SOLN
75.0000 mL | Freq: Once | INTRAVENOUS | Status: AC | PRN
  Administered 2019-09-07: 75 mL via INTRAVENOUS

## 2019-09-07 NOTE — Progress Notes (Signed)
PROGRESS NOTE    Shafiq Larch  YQM:[id number] DOB: March 01, 1965 DOA: 09/01/2019 PCP: [name], [name], MD     Brief Narrative:  Vail Vuncannon is a 54 y.o. WM PMHx HLD.  Pt with several day h/o SOB and cough.  Presented to PCP's office today.  Tested COVID positive.  Pt has not had vaccine.   ED Course: Tm 102.7, CXR with bilateral infiltrates C/W COVID.  Desats to 87% on RA, currently satting well but on 4L via [place].   Subjective: 8/8 afebrile overnight A/O x4, positive acute S OB but improved from the last couple days.  Negative nausea, negative vomiting.   Assessment & Plan: Covid vaccination; no vaccination   Active Problems:   Acute hypoxemic respiratory failure due to COVID-19 [facility])   Pneumonia due to COVID-19 virus   Community acquired pneumonia   HLD (hyperlipidemia)   Hypothyroidism   AKI (acute kidney injury) (HCC)  Acute respiratory failure with hypoxia/Covid 19 pneumonia COVID-19 Labs  Recent Labs    09/05/19 0406 09/06/19 0751 09/07/19 0639  DDIMER 1.13* 2.06* 2.06*  FERRITIN 307 266 307  LDH 487* 526* 352*  CRP 4.2* 3.9* 7.9*    Lab Results  Component Value Date   SARSCOV2NAA POSITIVE (A) 09/01/2019  -Remdesivir -Decadron IV 10 mg daily -Positive history hepatitis B therefore not Candidate for Actemra -Albuterol QID -Titrate O2 to maintain SPO2> 88% -Flutter valve -Incentive spirometry -Thiamine 100 mg daily -Folic acid 1 mg daily -Zinc 220 mg daily -Prone patient 8 hours/day; if patient cannot tolerate prone 2 to 3 hours per shift -Discussed with the patient that COVID-19 convalescent plasma (CCP) is not a proven therapy for COVID-19, randomized controlled trials have not shown evidence of benefits.  Counseled patient that he meet guidelines for the use of CCP secondary to the following;  Continued shortness of breath (dyspnea)  Continued episodes of respiratory frequency ?30/min  Continued SPO2 ?93% -After discussion patient was provided  Informed written consent using the Patient Consent for Convalescent Plasma use from [facility] IRB.  Both patient and I signed sheet. -8/5 transfuse 1 unit Covid Convalescent Plasma -8/8 patient's D-dimer trending up again will obtain CTA PE protocol and bilateral lower extremity Doppler to rule out PE/DVT  CAP -Continue empiric antibiotics 5 to 7-day course -8/4 sputum sample pending -8/4 respiratory viral panel pending -Lactic acid and procalcitonin  Results for TORELL, MINDER (MRN [id number]) as of 09/05/2019 14:05  Ref. Range 09/01/2019 17:55 09/01/2019 23:49 09/04/2019 16:02 09/04/2019 22:36  Lactic Acid, Venous Latest Ref Range: 0.5 - 1.9 mmol/L 2.6 (HH) 1.7 2.9 (HH) 1.9  Results for CLEOPHAS, YOAK (MRN [id number]) as of 09/05/2019 14:05  Ref. Range 09/01/2019 22:02 09/02/2019 04:06 09/03/2019 04:12 09/04/2019 16:02 09/05/2019 04:06  Procalcitonin Latest Units: ng/mL 0.55 0.59 0.40 <0.10 <0.10   Hyperlipidemia --Crestor 10 mg daily  Hypothyroidism -Synthroid 25 mcg daily  DM type II uncontrolled with complication -8/4 hemoglobin A1c= 8.5 -8/5 increase Levemir 14 units BID -8/5 increase NovoLog 8 units qac  -Moderate SSI -Tradjenta 5 mg daily   AKI (baseline Cr 1.0) Recent Labs  Lab 09/03/19 0412 09/04/19 0405 09/05/19 0406 09/06/19 0751 09/07/19 0639  CREATININE 1.04 0.84 0.62 0.72 0.69  -At baseline   GERD -Continue with PPI   DVT prophylaxis: Lovenox Code Status: Full Family Communication:  Status is: Inpatient    Dispo: The patient is from: Home              Anticipated d/c is to: Home  Anticipated d/c date is: 8/16              Patient currently unstable      Consultants:    Procedures/Significant Events:  8/2 PCXR;Bilateral streaky densities most consistent with COVID-19 infiltrate.    I have personally reviewed and interpreted all radiology studies and my findings are as above.  VENTILATOR SETTINGS: HFNC 8/8 Flow; 10 L/min SPO2;  88%    Cultures 8/2 SARS coronavirus positive 8/2 HIV screen negative 8/2 blood RIGHT AC NGTD 8/2 blood NGTD 8/4 virus panel negative   Antimicrobials: Anti-infectives (From admission, onward)   Start     Ordered Stop   09/03/19 1000  remdesivir 100 mg in sodium chloride 0.9 % 100 mL IVPB     Discontinue    "Followed by" Linked Group Details   09/01/19 2335 09/07/19 0959   09/02/19 2200  azithromycin (ZITHROMAX) 500 mg in sodium chloride 0.9 % 250 mL IVPB     Discontinue     09/02/19 0832 09/06/19 2159   09/02/19 2200  cefTRIAXone (ROCEPHIN) 1 g in sodium chloride 0.9 % 100 mL IVPB     Discontinue     09/02/19 0832     09/02/19 1600  valACYclovir (VALTREX) tablet 1,000 mg     Discontinue     09/02/19 1510     09/02/19 0100  remdesivir 200 mg in sodium chloride 0.9% 250 mL IVPB       "Followed by" Linked Group Details   09/01/19 2335 09/02/19 0119   09/01/19 2200  azithromycin (ZITHROMAX) 500 mg in sodium chloride 0.9 % 250 mL IVPB        09/01/19 2158 09/02/19 0033   09/01/19 2200  cefTRIAXone (ROCEPHIN) 1 g in sodium chloride 0.9 % 100 mL IVPB        09/01/19 2158 09/01/19 2306       Devices    LINES / TUBES:      Continuous Infusions: . cefTRIAXone (ROCEPHIN)  IV Stopped (09/07/19 0736)     Objective: Vitals:   09/06/19 1536 09/06/19 1808 09/06/19 2107 09/07/19 0526  BP: 127/72  123/67 107/65  Pulse:  75 69 73  Resp:  (!) 21 (!) 21 20  Temp: 99 F (37.2 C)  98.4 F (36.9 C) 97.7 F (36.5 C)  TempSrc: Oral  Oral Axillary  SpO2:  91% 90% (!) 88%  Weight:      Height:        Intake/Output Summary (Last 24 hours) at 09/07/2019 [id number] Last data filed at 09/07/2019 0531 Gross per 24 hour  Intake 820 ml  Output 1400 ml  Net -580 ml   Filed Weights   09/01/19 1805 09/05/19 1938  Weight: 108.9 kg 108.9 kg   Physical Exam:  General: A/O x4, positive acute respiratory distress Eyes: negative scleral hemorrhage, negative anisocoria, negative icterus ENT:  Negative Runny nose, negative gingival bleeding, Neck:  Negative scars, masses, torticollis, lymphadenopathy, JVD Lungs: decreased breath sounds, but now air movement in all lung fields, bilaterally, positive expiratory wheezes, negative crackles Cardiovascular: Regular rate and rhythm without murmur gallop or rub normal S1 and S2 Abdomen: negative abdominal pain, nondistended, positive soft, bowel sounds, no rebound, no ascites, no appreciable mass Extremities: No significant cyanosis, clubbing, or edema bilateral lower extremities Skin: Negative rashes, lesions, ulcers Psychiatric:  Negative depression, negative anxiety, negative fatigue, negative mania  Central nervous system:  Cranial nerves II through XII intact, tongue/uvula midline, all extremities muscle strength 5/5, sensation intact throughout,  negative dysarthria, negative expressive aphasia, negative receptive aphasia.  .     Data Reviewed: Care during the described time interval was provided by me .  I have reviewed this patient's available data, including medical history, events of note, physical examination, and all test results as part of my evaluation.  CBC: Recent Labs  Lab 09/03/19 0412 09/04/19 0405 09/05/19 0406 09/06/19 0751 09/07/19 0639  WBC 6.5 8.3 7.7 8.2 7.5  NEUTROABS 5.5 7.5 6.9 7.3 6.5  HGB 15.8 15.7 14.6 15.0 14.7  HCT 49.4 48.4 45.8 48.2 46.9  MCV 90.3 88.8 90.0 90.6 90.2  PLT 171 198 229 277 343   Basic Metabolic Panel: Recent Labs  Lab 09/03/19 0412 09/04/19 0405 09/05/19 0406 09/06/19 0751 09/07/19 0639  NA 139 138 139 138 139  K 4.2 3.8 3.9 3.9 4.0  CL 104 102 104 102 101  CO2 21* GLUCOSE 190* 171* 143* 81 99  BUN 26* CREATININE 1.04 0.84 0.62 0.72 0.69  CALCIUM 8.1* 8.2* 8.0* 8.5* 8.6*  MG  --  2.4 2.3 2.2 2.1  PHOS  --  2.6 2.6 2.9 3.2   GFR: Estimated Creatinine Clearance: 132.4 mL/min (by C-G formula based on SCr of 0.69 mg/dL). Liver Function  Tests: Recent Labs  Lab 09/03/19 0412 09/04/19 0405 09/05/19 0406 09/06/19 0751 09/07/19 0639  AST 78* 70* 60* 53* 40  ALT 55* 52* 46* 46* 46*  ALKPHOS 47 51 50 53 53  BILITOT 1.1 1.1 0.8 0.8 0.9  PROT 5.9* 6.0* 5.8* 6.4* 6.2*  ALBUMIN 2.6* 2.6* 2.4* 2.8* 2.5*   No results for input(s): LIPASE, AMYLASE in the last 168 hours. No results for input(s): AMMONIA in the last 168 hours. Coagulation Profile: No results for input(s): INR, PROTIME in the last 168 hours. Cardiac Enzymes: No results for input(s): CKTOTAL, CKMB, CKMBINDEX, TROPONINI in the last 168 hours. BNP (last 3 results) No results for input(s): PROBNP in the last 8760 hours. HbA1C: No results for input(s): HGBA1C in the last 72 hours. CBG: Recent Labs  Lab 09/06/19 0833 09/06/19 1148 09/06/19 1750 09/06/19 2109 09/07/19 0803  GLUCAP 73 208* 235* 246* 89   Lipid Profile: No results for input(s): CHOL, HDL, LDLCALC, TRIG, CHOLHDL, LDLDIRECT in the last 72 hours. Thyroid Function Tests: No results for input(s): TSH, T4TOTAL, FREET4, T3FREE, THYROIDAB in the last 72 hours. Anemia Panel: Recent Labs    09/06/19 0751 09/07/19 0639  FERRITIN 266 307   Sepsis Labs: Recent Labs  Lab 09/01/19 1755 09/01/19 2202 09/01/19 2349 09/02/19 0406 09/03/19 0412 09/04/19 1602 09/04/19 2236 09/05/19 0406 09/06/19 0751  PROCALCITON  --    < >  --    < > 0.40 <0.10  --  <0.10 <0.10  LATICACIDVEN 2.6*  --  1.7  --   --  2.9* 1.9  --   --    < > = values in this interval not displayed.    Recent Results (from the past 240 hour(s))  Blood culture (routine x 2)     Status: None   Collection Time: 09/01/19  5:56 PM   Specimen: BLOOD  Result Value Ref Range Status   Specimen Description BLOOD RIGHT ANTECUBITAL  Final   Special Requests   Final    BOTTLES DRAWN AEROBIC AND ANAEROBIC Blood Culture adequate volume   Culture   Final    NO GROWTH 5 DAYS Performed at [facility], [address]  [place], [place] [address]    Report Status 09/06/2019 FINAL  Final  SARS Coronavirus 2 by RT PCR (hospital order, performed in [facility] hospital lab) Nasopharyngeal Nasopharyngeal Swab     Status: Abnormal   Collection Time: 09/01/19  9:58 PM   Specimen: Nasopharyngeal Swab  Result Value Ref Range Status   SARS Coronavirus 2 POSITIVE (A) NEGATIVE Final    Comment: RESULT CALLED TO, READ BACK BY AND VERIFIED WITH: J SAWAPZKI RN 09/01/19 2358 JDW (NOTE) SARS-CoV-2 target nucleic acids are DETECTED  SARS-CoV-2 RNA is generally detectable in upper respiratory specimens  during the acute phase of infection.  Positive results are indicative  of the presence of the identified virus, but do not rule out bacterial infection or co-infection with other pathogens not detected by the test.  Clinical correlation with patient history and  other diagnostic information is necessary to determine patient infection status.  The expected result is negative.  Fact Sheet for Patients:   BoilerBrush.com.cy   Fact Sheet for Healthcare Providers:   https://pope.com/    This test is not yet approved or cleared by the [place] FDA and  has been authorized for detection and/or diagnosis of SARS-CoV-2 by FDA under an Emergency Use Authorization (EUA).  This EUA will remain in effect (meaning this test ca n be used) for the duration of  the COVID-19 declaration under Section 564(b)(1) of the Act, 21 U.S.C. section 360-bbb-3(b)(1), unless the authorization is terminated or revoked sooner.  Performed at [facility], [address] [place], [place] [address]   Blood culture (routine x 2)     Status: None   Collection Time: 09/01/19 10:02 PM   Specimen: BLOOD  Result Value Ref Range Status   Specimen Description BLOOD SITE NOT SPECIFIED  Final   Special Requests   Final    BOTTLES DRAWN AEROBIC AND ANAEROBIC Blood Culture adequate volume   Culture    Final    NO GROWTH 5 DAYS Performed at [facility], [address] [place], [place] [address]    Report Status 09/06/2019 FINAL  Final  Respiratory Panel by PCR     Status: None   Collection Time: 09/03/19 11:16 PM   Specimen: Nasopharyngeal Swab; Respiratory  Result Value Ref Range Status   Adenovirus NOT DETECTED NOT DETECTED Final   Coronavirus 229E NOT DETECTED NOT DETECTED Final    Comment: (NOTE) The Coronavirus on the Respiratory Panel, DOES NOT test for the novel  Coronavirus (2019 nCoV)    Coronavirus HKU1 NOT DETECTED NOT DETECTED Final   Coronavirus NL63 NOT DETECTED NOT DETECTED Final   Coronavirus OC43 NOT DETECTED NOT DETECTED Final   Metapneumovirus NOT DETECTED NOT DETECTED Final   Rhinovirus / Enterovirus NOT DETECTED NOT DETECTED Final   Influenza A NOT DETECTED NOT DETECTED Final   Influenza B NOT DETECTED NOT DETECTED Final   Parainfluenza Virus 1 NOT DETECTED NOT DETECTED Final   Parainfluenza Virus 2 NOT DETECTED NOT DETECTED Final   Parainfluenza Virus 3 NOT DETECTED NOT DETECTED Final   Parainfluenza Virus 4 NOT DETECTED NOT DETECTED Final   Respiratory Syncytial Virus NOT DETECTED NOT DETECTED Final   Bordetella pertussis NOT DETECTED NOT DETECTED Final   Chlamydophila pneumoniae NOT DETECTED NOT DETECTED Final   Mycoplasma pneumoniae NOT DETECTED NOT DETECTED Final    Comment: Performed at [facility], [address], [place], [place] [address]         Radiology  Studies: No results found.      Scheduled Meds: . AeroChamber Plus Flo-Vu Large  1 each Other Once  . albuterol  2 puff Inhalation BID  . aspirin EC  81 mg Oral Daily  . dexamethasone (DECADRON) injection  10 mg Intravenous Daily  . enoxaparin (LOVENOX) injection  40 mg Subcutaneous Daily  . feeding supplement (GLUCERNA SHAKE)  237 mL Oral BID BM  . folic acid  1,000 mcg Oral Daily  . gabapentin  300 mg Oral Daily  . gabapentin  600 mg Oral QHS  . insulin aspart   0-15 Units Subcutaneous TID WC  . insulin aspart  0-5 Units Subcutaneous QHS  . insulin aspart  8 Units Subcutaneous TID WC  . insulin detemir  14 Units Subcutaneous BID  . levothyroxine  25 mcg Oral QAC breakfast  . linagliptin  5 mg Oral Daily  . lisinopril  2.5 mg Oral Daily  . pantoprazole  40 mg Oral Daily  . rosuvastatin  10 mg Oral QPM  . sodium chloride flush  3 mL Intravenous Once  . thiamine  100 mg Oral Daily  . valACYclovir  1,000 mg Oral Daily  . zinc sulfate  220 mg Oral Daily   Continuous Infusions: . cefTRIAXone (ROCEPHIN)  IV Stopped (09/07/19 0736)     LOS: 6 days    Time spent:40 min    Areej Tayler, Roselind Messier, MD [facility] Pager [phone]  If 7PM-7AM, please contact night-coverage [email or web address] Password [facility] 09/07/2019, 8:38 AM

## 2019-09-07 NOTE — Plan of Care (Signed)
  Problem: Coping: Goal: Psychosocial and spiritual needs will be supported Outcome: Progressing   Problem: Respiratory: Goal: Will maintain a patent airway Outcome: Progressing   Problem: Education: Goal: Knowledge of General Education information will improve Description: Including pain rating scale, medication(s)/side effects and non-pharmacologic comfort measures Outcome: Progressing   Problem: Activity: Goal: Risk for activity intolerance will decrease Outcome: Progressing

## 2019-09-08 ENCOUNTER — Inpatient Hospital Stay (HOSPITAL_COMMUNITY): Payer: Commercial Managed Care - PPO

## 2019-09-08 DIAGNOSIS — E118 Type 2 diabetes mellitus with unspecified complications: Secondary | ICD-10-CM | POA: Diagnosis not present

## 2019-09-08 DIAGNOSIS — N179 Acute kidney failure, unspecified: Secondary | ICD-10-CM | POA: Diagnosis not present

## 2019-09-08 DIAGNOSIS — J9601 Acute respiratory failure with hypoxia: Secondary | ICD-10-CM | POA: Diagnosis not present

## 2019-09-08 DIAGNOSIS — U071 COVID-19: Secondary | ICD-10-CM

## 2019-09-08 DIAGNOSIS — R7989 Other specified abnormal findings of blood chemistry: Secondary | ICD-10-CM

## 2019-09-08 LAB — CBC WITH DIFFERENTIAL/PLATELET
Abs Immature Granulocytes: 0.16 10*3/uL — ABNORMAL HIGH (ref 0.00–0.07)
Basophils Absolute: 0 10*3/uL (ref 0.0–0.1)
Basophils Relative: 0 %
Eosinophils Absolute: 0.1 10*3/uL (ref 0.0–0.5)
Eosinophils Relative: 2 %
HCT: 45.9 % (ref 39.0–52.0)
Hemoglobin: 14.4 g/dL (ref 13.0–17.0)
Immature Granulocytes: 2 %
Lymphocytes Relative: 6 %
Lymphs Abs: 0.5 10*3/uL — ABNORMAL LOW (ref 0.7–4.0)
MCH: 28.3 pg (ref 26.0–34.0)
MCHC: 31.4 g/dL (ref 30.0–36.0)
MCV: 90.4 fL (ref 80.0–100.0)
Monocytes Absolute: 0.4 10*3/uL (ref 0.1–1.0)
Monocytes Relative: 5 %
Neutro Abs: 6.4 10*3/uL (ref 1.7–7.7)
Neutrophils Relative %: 85 %
Platelets: 446 10*3/uL — ABNORMAL HIGH (ref 150–400)
RBC: 5.08 MIL/uL (ref 4.22–5.81)
RDW: 13.6 % (ref 11.5–15.5)
WBC: 7.6 10*3/uL (ref 4.0–10.5)
nRBC: 0 % (ref 0.0–0.2)

## 2019-09-08 LAB — COMPREHENSIVE METABOLIC PANEL
ALT: 49 U/L — ABNORMAL HIGH (ref 0–44)
AST: 40 U/L (ref 15–41)
Albumin: 2.6 g/dL — ABNORMAL LOW (ref 3.5–5.0)
Alkaline Phosphatase: 53 U/L (ref 38–126)
Anion gap: 10 (ref 5–15)
BUN: 17 mg/dL (ref 6–20)
CO2: 27 mmol/L (ref 22–32)
Calcium: 8.6 mg/dL — ABNORMAL LOW (ref 8.9–10.3)
Chloride: 100 mmol/L (ref 98–111)
Creatinine, Ser: 0.91 mg/dL (ref 0.61–1.24)
GFR calc Af Amer: >60 mL/min (ref >60)
GFR calc non Af Amer: >60 mL/min (ref >60)
Glucose, Bld: 148 mg/dL — ABNORMAL HIGH (ref 70–99)
Potassium: 4.1 mmol/L (ref 3.5–5.1)
Sodium: 137 mmol/L (ref 135–145)
Total Bilirubin: 0.5 mg/dL (ref 0.3–1.2)
Total Protein: 6.4 g/dL — ABNORMAL LOW (ref 6.5–8.1)

## 2019-09-08 LAB — GLUCOSE, CAPILLARY
Glucose-Capillary: 114 mg/dL — ABNORMAL HIGH (ref 70–99)
Glucose-Capillary: 247 mg/dL — ABNORMAL HIGH (ref 70–99)
Glucose-Capillary: 234 mg/dL — ABNORMAL HIGH (ref 70–99)
Glucose-Capillary: 213 mg/dL — ABNORMAL HIGH (ref 70–99)

## 2019-09-08 LAB — C-REACTIVE PROTEIN: CRP: 6.2 mg/dL — ABNORMAL HIGH (ref <1.0)

## 2019-09-08 LAB — FERRITIN: Ferritin: 297 ng/mL (ref 24–336)

## 2019-09-08 LAB — LACTATE DEHYDROGENASE: LDH: 316 U/L — ABNORMAL HIGH (ref 98–192)

## 2019-09-08 LAB — PHOSPHORUS: Phosphorus: 3.6 mg/dL (ref 2.5–4.6)

## 2019-09-08 LAB — MAGNESIUM: Magnesium: 2.1 mg/dL (ref 1.7–2.4)

## 2019-09-08 LAB — D-DIMER, QUANTITATIVE: D-Dimer, Quant: 1.87 ug/mL-FEU — ABNORMAL HIGH (ref 0.00–0.50)

## 2019-09-08 NOTE — Progress Notes (Signed)
Inpatient Diabetes Program Recommendations  AACE/ADA: New Consensus Statement on Inpatient Glycemic Control (2015)  Target Ranges:  Prepandial:   less than 140 mg/dL      Peak postprandial:   less than 180 mg/dL (1-2 hours)      Critically ill patients:  140 - 180 mg/dL   Lab Results  Component Value Date   GLUCAP 247 (H) 09/08/2019   HGBA1C 8.5 (H) 09/03/2019    Review of Glycemic Control Results for Thomas Silva, Thomas Silva (MRN [id number]) as of 09/08/2019 12:04  Ref. Range 09/07/2019 08:03 09/07/2019 12:17 09/07/2019 16:49 09/07/2019 21:07 09/08/2019 07:48 09/08/2019 11:51  Glucose-Capillary Latest Ref Range: 70 - 99 mg/dL 89 268 (H) 341 (H) 962 (H) 114 (H) 247 (H)   Inpatient Diabetes Program Recommendations:   Please consider: -Increase Novolog meal coverage to 10 units tid if eats 50% meal Secure chat to Dr. Valentina Lucks with recommendations.  Thank you, Billy Fischer. Declyn Offield, RN, MSN, CDE  Diabetes Coordinator Inpatient Glycemic Control Team Team Pager [phone] (8am-5pm) 09/08/2019 12:12 PM

## 2019-09-08 NOTE — Progress Notes (Signed)
Lower extremity venous has been completed.   Preliminary results in CV Proc.   Blanch Media 09/08/2019 2:29 PM

## 2019-09-08 NOTE — Progress Notes (Signed)
PROGRESS NOTE    Thomas Silva  SEG:[id number] DOB: 01-17-1966 DOA: 09/01/2019 PCP: [name], [name], MD     Brief Narrative:  Thomas Silva is a 54 y.o. WM PMHx HLD.  Pt with several day h/o SOB and cough.  Presented to PCP's office today.  Tested COVID positive.  Pt has not had vaccine.   ED Course: Tm 102.7, CXR with bilateral infiltrates C/W COVID.  Desats to 87% on RA, currently satting well but on 4L via [place].   Subjective: 8/9afebrile overnight A/O x4, positive acute S OB but improved from the last couple days.  Negative nausea, negative vomiting.   Assessment & Plan: Covid vaccination; no vaccination   Active Problems:   Acute hypoxemic respiratory failure due to COVID-19 [facility])   Pneumonia due to COVID-19 virus   Community acquired pneumonia   HLD (hyperlipidemia)   Hypothyroidism   AKI (acute kidney injury) (HCC)  Acute respiratory failure with hypoxia/Covid 19 pneumonia COVID-19 Labs  Recent Labs    09/06/19 0751 09/07/19 0639 09/08/19 0255  DDIMER 2.06* 2.06* 1.87*  FERRITIN 266 307 297  LDH 526* 352* 316*  CRP 3.9* 7.9* 6.2*    Lab Results  Component Value Date   SARSCOV2NAA POSITIVE (A) 09/01/2019  -Remdesivir -Decadron IV 10 mg daily -Positive history hepatitis B therefore not Candidate for Actemra -Albuterol QID -Titrate O2 to maintain SPO2> 88% -Flutter valve -Incentive spirometry -Thiamine 100 mg daily -Folic acid 1 mg daily -Zinc 220 mg daily -Prone patient 8 hours/day; if patient cannot tolerate prone 2 to 3 hours per shift -Discussed with the patient that COVID-19 convalescent plasma (CCP) is not a proven therapy for COVID-19, randomized controlled trials have not shown evidence of benefits.  Counseled patient that he meet guidelines for the use of CCP secondary to the following;  Continued shortness of breath (dyspnea)  Continued episodes of respiratory frequency ?30/min  Continued SPO2 ?93% -After discussion patient was provided  Informed written consent using the Patient Consent for Convalescent Plasma use from [facility] : [facility] IRB.  Both patient and I signed sheet. -8/5 transfuse 1 unit Covid Convalescent Plasma -8/8 patient's D-dimer trending up again will obtain CTA PE protocol and bilateral lower extremity Doppler to rule out PE/DVT -8/9 CTA PE protocol negative for PE see results below  CAP -Continue empiric antibiotics 5 to 7-day course -8/4 sputum sample pending -8/4 respiratory viral panel negative -Lactic acid and procalcitonin  Results for BAUDELIO, KARNES (MRN [id number]) as of 09/05/2019 14:05  Ref. Range 09/01/2019 17:55 09/01/2019 23:49 09/04/2019 16:02 09/04/2019 22:36  Lactic Acid, Venous Latest Ref Range: 0.5 - 1.9 mmol/L 2.6 (HH) 1.7 2.9 (HH) 1.9  Results for ELDEN, BRUCATO (MRN [id number]) as of 09/05/2019 14:05  Ref. Range 09/01/2019 22:02 09/02/2019 04:06 09/03/2019 04:12 09/04/2019 16:02 09/05/2019 04:06  Procalcitonin Latest Units: ng/mL 0.55 0.59 0.40 <0.10 <0.10   Hyperlipidemia --Crestor 10 mg daily  Hypothyroidism -Synthroid 25 mcg daily  DM type II uncontrolled with complication -8/4 hemoglobin A1c= 8.5 -8/5 increase Levemir 14 units BID -8/5 increase NovoLog 8 units qac  -Moderate SSI -Tradjenta 5 mg daily   AKI (baseline Cr 1.0) Recent Labs  Lab 09/04/19 0405 09/05/19 0406 09/06/19 0751 09/07/19 0639 09/08/19 0255  CREATININE 0.84 0.62 0.72 0.69 0.91  -At baseline   GERD -Continue with PPI   DVT prophylaxis: Lovenox Code Status: Full Family Communication:  Status is: Inpatient    Dispo: The patient is from: Home  Anticipated d/c is to: Home              Anticipated d/c date is: 8/16              Patient currently unstable      Consultants:    Procedures/Significant Events:  8/2 PCXR;Bilateral streaky densities most consistent with COVID-19 Infiltrate. 8/8 CTA chest PE protocol;-negative PE 2. Very extensive bilateral, somewhat geographic heterogeneous  and ground-glass airspace opacity throughout the lungs, consistent with COVID-19 airspace disease. 3. Coronary artery disease.    I have personally reviewed and interpreted all radiology studies and my findings are as above.  VENTILATOR SETTINGS: HFNC8/9 Flow; 8 L/min SPO2; 88%    Cultures 8/2 SARS coronavirus positive 8/2 HIV screen negative 8/2 blood RIGHT AC NGTD 8/2 blood NGTD 8/4 virus panel negative   Antimicrobials: Anti-infectives (From admission, onward)   Start     Ordered Stop   09/03/19 1000  remdesivir 100 mg in sodium chloride 0.9 % 100 mL IVPB     Discontinue    "Followed by" Linked Group Details   09/01/19 2335 09/07/19 0959   09/02/19 2200  azithromycin (ZITHROMAX) 500 mg in sodium chloride 0.9 % 250 mL IVPB     Discontinue     09/02/19 0832 09/06/19 2159   09/02/19 2200  cefTRIAXone (ROCEPHIN) 1 g in sodium chloride 0.9 % 100 mL IVPB     Discontinue     09/02/19 0832     09/02/19 1600  valACYclovir (VALTREX) tablet 1,000 mg     Discontinue     09/02/19 1510     09/02/19 0100  remdesivir 200 mg in sodium chloride 0.9% 250 mL IVPB       "Followed by" Linked Group Details   09/01/19 2335 09/02/19 0119   09/01/19 2200  azithromycin (ZITHROMAX) 500 mg in sodium chloride 0.9 % 250 mL IVPB        09/01/19 2158 09/02/19 0033   09/01/19 2200  cefTRIAXone (ROCEPHIN) 1 g in sodium chloride 0.9 % 100 mL IVPB        09/01/19 2158 09/01/19 2306       Devices    LINES / TUBES:      Continuous Infusions:    Objective: Vitals:   09/07/19 2109 09/08/19 0021 09/08/19 0648 09/08/19 0745  BP: 113/64 (!) 107/57 105/71 101/74  Pulse: 67   62  Resp: (!) Temp: 98.1 F (36.7 C) 98.3 F (36.8 C) 98 F (36.7 C) 98.1 F (36.7 C)  TempSrc: Oral Oral Oral Oral  SpO2: 90%   93%  Weight:      Height:        Intake/Output Summary (Last 24 hours) at 09/08/2019 1120 Last data filed at 09/08/2019 1000 Gross per 24 hour  Intake 680 ml  Output 1350 ml   Net -670 ml   Filed Weights   09/01/19 1805 09/05/19 1938  Weight: 108.9 kg 108.9 kg   Physical Exam:  General: A/O x4, positive acute respiratory distress Eyes: negative scleral hemorrhage, negative anisocoria, negative icterus ENT: Negative Runny nose, negative gingival bleeding, Neck:  Negative scars, masses, torticollis, lymphadenopathy, JVD Lungs: decreased breath sounds, but now air movement in all lung fields, bilaterally, positive expiratory wheezes, negative crackles Cardiovascular: Regular rate and rhythm without murmur gallop or rub normal S1 and S2 Abdomen: negative abdominal pain, nondistended, positive soft, bowel sounds, no rebound, no ascites, no appreciable mass Extremities: No significant cyanosis, clubbing, or edema  bilateral lower extremities Skin: Negative rashes, lesions, ulcers Psychiatric:  Negative depression, negative anxiety, negative fatigue, negative mania  Central nervous system:  Cranial nerves II through XII intact, tongue/uvula midline, all extremities muscle strength 5/5, sensation intact throughout, negative dysarthria, negative expressive aphasia, negative receptive aphasia.  .     Data Reviewed: Care during the described time interval was provided by me .  I have reviewed this patient's available data, including medical history, events of note, physical examination, and all test results as part of my evaluation.  CBC: Recent Labs  Lab 09/04/19 0405 09/05/19 0406 09/06/19 0751 09/07/19 0639 09/08/19 0255  WBC 8.3 7.7 8.2 7.5 7.6  NEUTROABS 7.5 6.9 7.3 6.5 6.4  HGB 15.7 14.6 15.0 14.7 14.4  HCT 48.4 45.8 48.2 46.9 45.9  MCV 88.8 90.0 90.6 90.2 90.4  PLT 198 229 277 343 446*   Basic Metabolic Panel: Recent Labs  Lab 09/04/19 0405 09/05/19 0406 09/06/19 0751 09/07/19 0639 09/08/19 0255  NA 138 139 138 139 137  K 3.8 3.9 3.9 4.0 4.1  CL 102 104 102 101 100  CO2 25 24 24 24 27   GLUCOSE 171* 143* 81 99 148*  BUN 20 16 18 18 17    CREATININE 0.84 0.62 0.72 0.69 0.91  CALCIUM 8.2* 8.0* 8.5* 8.6* 8.6*  MG 2.4 2.3 2.2 2.1 2.1  PHOS 2.6 2.6 2.9 3.2 3.6   GFR: Estimated Creatinine Clearance: 116.4 mL/min (by C-G formula based on SCr of 0.91 mg/dL). Liver Function Tests: Recent Labs  Lab 09/04/19 0405 09/05/19 0406 09/06/19 0751 09/07/19 0639 09/08/19 0255  AST 70* 60* 53* 40 40  ALT 52* 46* 46* 46* 49*  ALKPHOS 51 50 53 53 53  BILITOT 1.1 0.8 0.8 0.9 0.5  PROT 6.0* 5.8* 6.4* 6.2* 6.4*  ALBUMIN 2.6* 2.4* 2.8* 2.5* 2.6*   No results for input(s): LIPASE, AMYLASE in the last 168 hours. No results for input(s): AMMONIA in the last 168 hours. Coagulation Profile: No results for input(s): INR, PROTIME in the last 168 hours. Cardiac Enzymes: No results for input(s): CKTOTAL, CKMB, CKMBINDEX, TROPONINI in the last 168 hours. BNP (last 3 results) No results for input(s): PROBNP in the last 8760 hours. HbA1C: No results for input(s): HGBA1C in the last 72 hours. CBG: Recent Labs  Lab 09/07/19 0803 09/07/19 1217 09/07/19 1649 09/07/19 2107 09/08/19 0748  GLUCAP 89 223* 260* 222* 114*   Lipid Profile: No results for input(s): CHOL, HDL, LDLCALC, TRIG, CHOLHDL, LDLDIRECT in the last 72 hours. Thyroid Function Tests: No results for input(s): TSH, T4TOTAL, FREET4, T3FREE, THYROIDAB in the last 72 hours. Anemia Panel: Recent Labs    09/07/19 0639 09/08/19 0255  FERRITIN 307 297   Sepsis Labs: Recent Labs  Lab 09/01/19 1755 09/01/19 2202 09/01/19 2349 09/02/19 0406 09/03/19 0412 09/04/19 1602 09/04/19 2236 09/05/19 0406 09/06/19 0751  PROCALCITON  --    < >  --    < > 0.40 <0.10  --  <0.10 <0.10  LATICACIDVEN 2.6*  --  1.7  --   --  2.9* 1.9  --   --    < > = values in this interval not displayed.    Recent Results (from the past 240 hour(s))  Blood culture (routine x 2)     Status: None   Collection Time: 09/01/19  5:56 PM   Specimen: BLOOD  Result Value Ref Range Status   Specimen  Description BLOOD RIGHT ANTECUBITAL  Final   Special Requests  Final    BOTTLES DRAWN AEROBIC AND ANAEROBIC Blood Culture adequate volume   Culture   Final    NO GROWTH 5 DAYS Performed at [facility], [address] [place], [place] [address]    Report Status 09/06/2019 FINAL  Final  SARS Coronavirus 2 by RT PCR (hospital order, performed in [facility] hospital lab) Nasopharyngeal Nasopharyngeal Swab     Status: Abnormal   Collection Time: 09/01/19  9:58 PM   Specimen: Nasopharyngeal Swab  Result Value Ref Range Status   SARS Coronavirus 2 POSITIVE (A) NEGATIVE Final    Comment: RESULT CALLED TO, READ BACK BY AND VERIFIED WITH: J SAWAPZKI RN 09/01/19 2358 JDW (NOTE) SARS-CoV-2 target nucleic acids are DETECTED  SARS-CoV-2 RNA is generally detectable in upper respiratory specimens  during the acute phase of infection.  Positive results are indicative  of the presence of the identified virus, but do not rule out bacterial infection or co-infection with other pathogens not detected by the test.  Clinical correlation with patient history and  other diagnostic information is necessary to determine patient infection status.  The expected result is negative.  Fact Sheet for Patients:   BoilerBrush.com.cyhttps://www.fda.gov/media/136312/download   Fact Sheet for Healthcare Providers:   https://pope.com/https://www.fda.gov/media/136313/download    This test is not yet approved or cleared by the [place] FDA and  has been authorized for detection and/or diagnosis of SARS-CoV-2 by FDA under an Emergency Use Authorization (EUA).  This EUA will remain in effect (meaning this test ca n be used) for the duration of  the COVID-19 declaration under Section 564(b)(1) of the Act, 21 U.S.C. section 360-bbb-3(b)(1), unless the authorization is terminated or revoked sooner.  Performed at [facility], [address] [place], [place] [address]   Blood culture (routine x 2)     Status: None   Collection Time:  09/01/19 10:02 PM   Specimen: BLOOD  Result Value Ref Range Status   Specimen Description BLOOD SITE NOT SPECIFIED  Final   Special Requests   Final    BOTTLES DRAWN AEROBIC AND ANAEROBIC Blood Culture adequate volume   Culture   Final    NO GROWTH 5 DAYS Performed at [facility], [address] [place], [place] [address]    Report Status 09/06/2019 FINAL  Final  Respiratory Panel by PCR     Status: None   Collection Time: 09/03/19 11:16 PM   Specimen: Nasopharyngeal Swab; Respiratory  Result Value Ref Range Status   Adenovirus NOT DETECTED NOT DETECTED Final   Coronavirus 229E NOT DETECTED NOT DETECTED Final    Comment: (NOTE) The Coronavirus on the Respiratory Panel, DOES NOT test for the novel  Coronavirus (2019 nCoV)    Coronavirus HKU1 NOT DETECTED NOT DETECTED Final   Coronavirus NL63 NOT DETECTED NOT DETECTED Final   Coronavirus OC43 NOT DETECTED NOT DETECTED Final   Metapneumovirus NOT DETECTED NOT DETECTED Final   Rhinovirus / Enterovirus NOT DETECTED NOT DETECTED Final   Influenza A NOT DETECTED NOT DETECTED Final   Influenza B NOT DETECTED NOT DETECTED Final   Parainfluenza Virus 1 NOT DETECTED NOT DETECTED Final   Parainfluenza Virus 2 NOT DETECTED NOT DETECTED Final   Parainfluenza Virus 3 NOT DETECTED NOT DETECTED Final   Parainfluenza Virus 4 NOT DETECTED NOT DETECTED Final   Respiratory Syncytial Virus NOT DETECTED NOT DETECTED Final   Bordetella pertussis NOT DETECTED NOT DETECTED Final   Chlamydophila pneumoniae NOT DETECTED NOT DETECTED Final  Mycoplasma pneumoniae NOT DETECTED NOT DETECTED Final    Comment: Performed at [facility], [address], [place], [place] [address]         Radiology Studies: CT ANGIO CHEST PE W OR WO CONTRAST  Result Date: 09/07/2019 CLINICAL DATA:  COVID positive, shortness of breath, rule out PE EXAM: CT ANGIOGRAPHY CHEST WITH CONTRAST TECHNIQUE: Multidetector CT imaging of the chest was performed using the  standard protocol during bolus administration of intravenous contrast. Multiplanar CT image reconstructions and MIPs were obtained to evaluate the vascular anatomy. CONTRAST:  13mL OMNIPAQUE IOHEXOL 350 MG/ML SOLN COMPARISON:  None. FINDINGS: Cardiovascular: Satisfactory opacification of the pulmonary arteries to the segmental level. No evidence of pulmonary embolism. Normal heart size. Left coronary artery calcifications. No pericardial effusion. Mediastinum/Nodes: No enlarged mediastinal, hilar, or axillary lymph nodes. Thyroid gland, trachea, and esophagus demonstrate no significant findings. Lungs/Pleura: Very extensive bilateral, somewhat geographic heterogeneous and ground-glass airspace opacity throughout the lungs. No pleural effusion or pneumothorax. Upper Abdomen: No acute abnormality. Musculoskeletal: No chest wall abnormality. No acute or significant osseous findings. Review of the MIP images confirms the above findings. IMPRESSION: 1. Negative examination for pulmonary embolism. 2. Very extensive bilateral, somewhat geographic heterogeneous and ground-glass airspace opacity throughout the lungs, consistent with COVID-19 airspace disease. 3. Coronary artery disease. Electronically Signed   By: Lauralyn Primes M.D.   On: 09/07/2019 18:29        Scheduled Meds: . AeroChamber Plus Flo-Vu Large  1 each Other Once  . albuterol  2 puff Inhalation BID  . aspirin EC  81 mg Oral Daily  . dexamethasone (DECADRON) injection  10 mg Intravenous Daily  . enoxaparin (LOVENOX) injection  40 mg Subcutaneous Daily  . feeding supplement (GLUCERNA SHAKE)  237 mL Oral BID BM  . folic acid  1,000 mcg Oral Daily  . gabapentin  300 mg Oral Daily  . gabapentin  600 mg Oral QHS  . insulin aspart  0-15 Units Subcutaneous TID WC  . insulin aspart  0-5 Units Subcutaneous QHS  . insulin aspart  8 Units Subcutaneous TID WC  . insulin detemir  14 Units Subcutaneous BID  . levothyroxine  25 mcg Oral QAC breakfast  .  linagliptin  5 mg Oral Daily  . lisinopril  2.5 mg Oral Daily  . pantoprazole  40 mg Oral Daily  . rosuvastatin  10 mg Oral QPM  . sodium chloride flush  3 mL Intravenous Once  . thiamine  100 mg Oral Daily  . valACYclovir  1,000 mg Oral Daily  . zinc sulfate  220 mg Oral Daily   Continuous Infusions:    LOS: 7 days    Time spent:40 min    Keilani Terrance, Roselind Messier, MD [facility] Pager [phone]  If 7PM-7AM, please contact night-coverage [email or web address] Password [email or web address] 09/08/2019, 11:20 AM

## 2019-09-09 DIAGNOSIS — J9601 Acute respiratory failure with hypoxia: Secondary | ICD-10-CM | POA: Diagnosis not present

## 2019-09-09 DIAGNOSIS — U071 COVID-19: Secondary | ICD-10-CM | POA: Diagnosis not present

## 2019-09-09 DIAGNOSIS — N179 Acute kidney failure, unspecified: Secondary | ICD-10-CM | POA: Diagnosis not present

## 2019-09-09 DIAGNOSIS — E118 Type 2 diabetes mellitus with unspecified complications: Secondary | ICD-10-CM | POA: Diagnosis not present

## 2019-09-09 LAB — PHOSPHORUS: Phosphorus: 4 mg/dL (ref 2.5–4.6)

## 2019-09-09 LAB — C-REACTIVE PROTEIN: CRP: 1.8 mg/dL — ABNORMAL HIGH (ref <1.0)

## 2019-09-09 LAB — COMPREHENSIVE METABOLIC PANEL
ALT: 48 U/L — ABNORMAL HIGH (ref 0–44)
AST: 35 U/L (ref 15–41)
Albumin: 2.6 g/dL — ABNORMAL LOW (ref 3.5–5.0)
Alkaline Phosphatase: 49 U/L (ref 38–126)
Anion gap: 13 (ref 5–15)
BUN: 20 mg/dL (ref 6–20)
CO2: 24 mmol/L (ref 22–32)
Calcium: 8.8 mg/dL — ABNORMAL LOW (ref 8.9–10.3)
Chloride: 99 mmol/L (ref 98–111)
Creatinine, Ser: 0.71 mg/dL (ref 0.61–1.24)
GFR calc Af Amer: >60 mL/min (ref >60)
GFR calc non Af Amer: >60 mL/min (ref >60)
Glucose, Bld: 114 mg/dL — ABNORMAL HIGH (ref 70–99)
Potassium: 4.7 mmol/L (ref 3.5–5.1)
Sodium: 136 mmol/L (ref 135–145)
Total Bilirubin: 0.9 mg/dL (ref 0.3–1.2)
Total Protein: 6.2 g/dL — ABNORMAL LOW (ref 6.5–8.1)

## 2019-09-09 LAB — CBC WITH DIFFERENTIAL/PLATELET
Abs Immature Granulocytes: 0.24 10*3/uL — ABNORMAL HIGH (ref 0.00–0.07)
Basophils Absolute: 0 10*3/uL (ref 0.0–0.1)
Basophils Relative: 1 %
Eosinophils Absolute: 0.2 10*3/uL (ref 0.0–0.5)
Eosinophils Relative: 3 %
HCT: 49.1 % (ref 39.0–52.0)
Hemoglobin: 15.6 g/dL (ref 13.0–17.0)
Immature Granulocytes: 3 %
Lymphocytes Relative: 7 %
Lymphs Abs: 0.5 10*3/uL — ABNORMAL LOW (ref 0.7–4.0)
MCH: 29.2 pg (ref 26.0–34.0)
MCHC: 31.8 g/dL (ref 30.0–36.0)
MCV: 91.9 fL (ref 80.0–100.0)
Monocytes Absolute: 0.6 10*3/uL (ref 0.1–1.0)
Monocytes Relative: 7 %
Neutro Abs: 6.3 10*3/uL (ref 1.7–7.7)
Neutrophils Relative %: 79 %
Platelets: 485 10*3/uL — ABNORMAL HIGH (ref 150–400)
RBC: 5.34 MIL/uL (ref 4.22–5.81)
RDW: 13.7 % (ref 11.5–15.5)
WBC: 7.9 10*3/uL (ref 4.0–10.5)
nRBC: 0 % (ref 0.0–0.2)

## 2019-09-09 LAB — D-DIMER, QUANTITATIVE: D-Dimer, Quant: 1.79 ug/mL-FEU — ABNORMAL HIGH (ref 0.00–0.50)

## 2019-09-09 LAB — GLUCOSE, CAPILLARY
Glucose-Capillary: 102 mg/dL — ABNORMAL HIGH (ref 70–99)
Glucose-Capillary: 186 mg/dL — ABNORMAL HIGH (ref 70–99)
Glucose-Capillary: 310 mg/dL — ABNORMAL HIGH (ref 70–99)
Glucose-Capillary: 152 mg/dL — ABNORMAL HIGH (ref 70–99)

## 2019-09-09 LAB — LACTATE DEHYDROGENASE: LDH: 347 U/L — ABNORMAL HIGH (ref 98–192)

## 2019-09-09 LAB — FERRITIN: Ferritin: 239 ng/mL (ref 24–336)

## 2019-09-09 LAB — MAGNESIUM: Magnesium: 2 mg/dL (ref 1.7–2.4)

## 2019-09-09 NOTE — Plan of Care (Signed)
  Problem: Respiratory: Goal: Will maintain a patent airway Outcome: Progressing Goal: Complications related to the disease process, condition or treatment will be avoided or minimized Outcome: Progressing   Problem: Education: Goal: Knowledge of General Education information will improve Description: Including pain rating scale, medication(s)/side effects and non-pharmacologic comfort measures Outcome: Progressing   Problem: Activity: Goal: Risk for activity intolerance will decrease Outcome: Progressing   Problem: Nutrition: Goal: Adequate nutrition will be maintained Outcome: Progressing

## 2019-09-09 NOTE — Progress Notes (Signed)
Nutrition Follow-up  DOCUMENTATION CODES:   Obesity unspecified  INTERVENTION:  Continue Glucerna Shake po BID, each supplement provides 220 kcal and 10 grams of protein.  Encourage adequate PO intake.   NUTRITION DIAGNOSIS:   Increased nutrient needs related to acute illness (COVID) as evidenced by estimated needs; ongoing  GOAL:   Patient will meet greater than or equal to 90% of their needs; met  MONITOR:   PO intake, Supplement acceptance, Skin, Weight trends, Labs, I & O's  REASON FOR ASSESSMENT:   Malnutrition Screening Tool    ASSESSMENT:   54 y.o. male with medical history significant of HLD.  Pt with several day h/o SOB and cough. Tested COVID positive.  RD working remotely. Pt is currently on 5 L HFNC. Meal completion has been 100%. Pt has been tolerating his PO diet. Pt currently has Glucerna Shake ordered and has been consuming them. RD to continue with current orders to aid in increased caloric and protein needs. Labs and medications reviewed.   Diet Order:   Diet Order            Diet Heart Room service appropriate? Yes; Fluid consistency: Thin  Diet effective now                 EDUCATION NEEDS:   Not appropriate for education at this time  Skin:  Skin Assessment: Reviewed RN Assessment  Last BM:  8/5  Height:   Ht Readings from Last 1 Encounters:  09/01/19 5' 11"  (1.803 m)    Weight:   Wt Readings from Last 1 Encounters:  09/05/19 108.9 kg   BMI:  Body mass index is 33.48 kg/m.  Estimated Nutritional Needs:   Kcal:  2150-2350  Protein:  110-120 grams  Fluid:  >/= 2 L/day  Corrin Parker, MS, RD, LDN RD pager number/after hours weekend pager number on Amion.

## 2019-09-09 NOTE — Progress Notes (Signed)
PROGRESS NOTE    Thomas Silva  IWP:[id number] DOB: [birth year] DOA: 09/01/2019 PCP: [name], [name], MD     Brief Narrative:  Thomas Silva is a 54 y.o. WM PMHx HLD.  Pt with several day h/o SOB and cough.  Presented to PCP's office today.  Tested COVID positive.  Pt has not had vaccine.   ED Course: Tm 102.7, CXR with bilateral infiltrates C/W COVID.  Desats to 87% on RA, currently satting well but on 4L via [place].   Subjective: 8/10 afebrile overnight, A/O x4, positive acute S OB but continues to improve.  Negative nausea, negative vomiting.  Believes he is ready to start participating with PT/OT    Assessment & Plan: Covid vaccination; no vaccination   Active Problems:   Acute hypoxemic respiratory failure due to COVID-19 [facility])   Pneumonia due to COVID-19 virus   Community acquired pneumonia   HLD (hyperlipidemia)   Hypothyroidism   AKI (acute kidney injury) (HCC)  Acute respiratory failure with hypoxia/Covid 19 pneumonia COVID-19 Labs  Recent Labs    09/07/19 0639 09/08/19 0255 09/09/19 0500  DDIMER 2.06* 1.87* 1.79*  FERRITIN 307 297 239  LDH 352* 316* 347*  CRP 7.9* 6.2* 1.8*    Lab Results  Component Value Date   SARSCOV2NAA POSITIVE (A) 09/01/2019  -Remdesivir -Decadron IV 10 mg daily -Positive history hepatitis B therefore not Candidate for Actemra -Albuterol QID -Titrate O2 to maintain SPO2> 88% -Flutter valve -Incentive spirometry -Thiamine 100 mg daily -Folic acid 1 mg daily -Zinc 220 mg daily -Prone patient 8 hours/day; if patient cannot tolerate prone 2 to 3 hours per shift -Discussed with the patient that COVID-19 convalescent plasma (CCP) is not a proven therapy for COVID-19, randomized controlled trials have not shown evidence of benefits.  Counseled patient that he meet guidelines for the use of CCP secondary to the following;  Continued shortness of breath (dyspnea)  Continued episodes of respiratory frequency ?30/min  Continued  SPO2 ?93% -After discussion patient was provided Informed written consent using the Patient Consent for Convalescent Plasma use from [facility] IRB.  Both patient and I signed sheet. -8/5 transfuse 1 unit Covid Convalescent Plasma -8/8 patient's D-dimer trending up again will obtain CTA PE protocol and bilateral lower extremity Doppler to rule out PE/DVT -8/9 CTA PE protocol negative for PE see results below -8/10 bilateral lower extremity Doppler negative see results below  CAP -Continue empiric antibiotics 5 to 7-day course -8/4 sputum sample pending -8/4 respiratory viral panel negative -Lactic acid and procalcitonin  Results for REAGAN, KLEMZ (MRN [id number]) as of 09/05/2019 14:05  Ref. Range 09/01/2019 17:55 09/01/2019 23:49 09/04/2019 16:02 09/04/2019 22:36  Lactic Acid, Venous Latest Ref Range: 0.5 - 1.9 mmol/L 2.6 (HH) 1.7 2.9 (HH) 1.9  Results for LUNDON, VERDEJO (MRN [id number]) as of 09/05/2019 14:05  Ref. Range 09/01/2019 22:02 09/02/2019 04:06 09/03/2019 04:12 09/04/2019 16:02 09/05/2019 04:06  Procalcitonin Latest Units: ng/mL 0.55 0.59 0.40 <0.10 <0.10   Hyperlipidemia --Crestor 10 mg daily  Hypothyroidism -Synthroid 25 mcg daily  DM type II uncontrolled with complication -8/4 hemoglobin A1c= 8.5 -8/5 increase Levemir 14 units BID -8/5 increase NovoLog 8 units qac  -Moderate SSI -Tradjenta 5 mg daily   AKI (baseline Cr 1.0) Recent Labs  Lab 09/05/19 0406 09/06/19 0751 09/07/19 0639 09/08/19 0255 09/09/19 0500  CREATININE 0.62 0.72 0.69 0.91 0.71  -At baseline   GERD -Continue with PPI  Goals of care -8/10 PT/OT consult; patient with Covid pneumonia, serious deconditioning evaluate for CIR  vs SNF   DVT prophylaxis: Lovenox Code Status: Full Family Communication:  Status is: Inpatient    Dispo: The patient is from: Home              Anticipated d/c is to: Home              Anticipated d/c date is: 8/16              Patient currently unstable      Consultants:     Procedures/Significant Events negative DVT 8/2 PCXR;Bilateral streaky densities most consistent with COVID-19 Infiltrate. 8/8 CTA chest PE protocol;-negative PE 2. Very extensive bilateral, somewhat geographic heterogeneous and ground-glass airspace opacity throughout the lungs, consistent with COVID-19 airspace disease. 3. Coronary artery disease. 8/9 lower extremity Doppler; negative DVT   I have personally reviewed and interpreted all radiology studies and my findings are as above.  VENTILATOR SETTINGS: HFNC 8/10  flow; 5 L/min SPO2; 91%    Cultures 8/2 SARS coronavirus positive 8/2 HIV screen negative 8/2 blood RIGHT AC NGTD 8/2 blood NGTD 8/4 virus panel negative   Antimicrobials: Anti-infectives (From admission, onward)   Start     Ordered Stop   09/03/19 1000  remdesivir 100 mg in sodium chloride 0.9 % 100 mL IVPB     Discontinue    "Followed by" Linked Group Details   09/01/19 2335 09/07/19 0959   09/02/19 2200  azithromycin (ZITHROMAX) 500 mg in sodium chloride 0.9 % 250 mL IVPB     Discontinue     09/02/19 0832 09/06/19 2159   09/02/19 2200  cefTRIAXone (ROCEPHIN) 1 g in sodium chloride 0.9 % 100 mL IVPB     Discontinue     09/02/19 0832     09/02/19 1600  valACYclovir (VALTREX) tablet 1,000 mg     Discontinue     09/02/19 1510     09/02/19 0100  remdesivir 200 mg in sodium chloride 0.9% 250 mL IVPB       "Followed by" Linked Group Details   09/01/19 2335 09/02/19 0119   09/01/19 2200  azithromycin (ZITHROMAX) 500 mg in sodium chloride 0.9 % 250 mL IVPB        09/01/19 2158 09/02/19 0033   09/01/19 2200  cefTRIAXone (ROCEPHIN) 1 g in sodium chloride 0.9 % 100 mL IVPB        09/01/19 2158 09/01/19 2306       Devices    LINES / TUBES:      Continuous Infusions:    Objective: Vitals:   09/09/19 0744 09/09/19 0811 09/09/19 1042 09/09/19 1304  BP: (!) 93/52 104/62  104/66  Pulse: (!) 57 60 66 68  Resp: 20 16 (!) 21 20  Temp: 97.9 F  (36.6 C) 97.9 F (36.6 C)  98.6 F (37 C)  TempSrc: Oral Oral  Oral  SpO2: 94% 95% 91% 91%  Weight:      Height:        Intake/Output Summary (Last 24 hours) at 09/09/2019 1324 Last data filed at 09/09/2019 0930 Gross per 24 hour  Intake 300 ml  Output 300 ml  Net 0 ml   Filed Weights   09/01/19 1805 09/05/19 1938  Weight: 108.9 kg 108.9 kg   Physical Exam:  General: A/O x4, positive acute respiratory distress Eyes: negative scleral hemorrhage, negative anisocoria, negative icterus ENT: Negative Runny nose, negative gingival bleeding, Neck:  Negative scars, masses, torticollis, lymphadenopathy, JVD Lungs: decreased breath sounds, but now air movement in all lung  fields, bilaterally, positive expiratory wheezes, negative crackles Cardiovascular: Regular rate and rhythm without murmur gallop or rub normal S1 and S2 Abdomen: negative abdominal pain, nondistended, positive soft, bowel sounds, no rebound, no ascites, no appreciable mass Extremities: No significant cyanosis, clubbing, or edema bilateral lower extremities Skin: Negative rashes, lesions, ulcers Psychiatric:  Negative depression, negative anxiety, negative fatigue, negative mania  Central nervous system:  Cranial nerves II through XII intact, tongue/uvula midline, all extremities muscle strength 5/5, sensation intact throughout, negative dysarthria, negative expressive aphasia, negative receptive aphasia.  .     Data Reviewed: Care during the described time interval was provided by me .  I have reviewed this patient's available data, including medical history, events of note, physical examination, and all test results as part of my evaluation.  CBC: Recent Labs  Lab 09/05/19 0406 09/06/19 0751 09/07/19 0639 09/08/19 0255 09/09/19 0500  WBC 7.7 8.2 7.5 7.6 7.9  NEUTROABS 6.9 7.3 6.5 6.4 6.3  HGB 14.6 15.0 14.7 14.4 15.6  HCT 45.8 48.2 46.9 45.9 49.1  MCV 90.0 90.6 90.2 90.4 91.9  PLT 229 277 343 446* 485*    Basic Metabolic Panel: Recent Labs  Lab 09/05/19 0406 09/06/19 0751 09/07/19 0639 09/08/19 0255 09/09/19 0500  NA 139 138 139 137 136  K 3.9 3.9 4.0 4.1 4.7  CL 104 102 101 100 99  CO2 24 24 24 27 24   GLUCOSE 143* 81 99 148* 114*  BUN 16 18 18 17 20   CREATININE 0.62 0.72 0.69 0.91 0.71  CALCIUM 8.0* 8.5* 8.6* 8.6* 8.8*  MG 2.3 2.2 2.1 2.1 2.0  PHOS 2.6 2.9 3.2 3.6 4.0   GFR: Estimated Creatinine Clearance: 132.4 mL/min (by C-G formula based on SCr of 0.71 mg/dL). Liver Function Tests: Recent Labs  Lab 09/05/19 0406 09/06/19 0751 09/07/19 0639 09/08/19 0255 09/09/19 0500  AST 60* 53* 40 40 35  ALT 46* 46* 46* 49* 48*  ALKPHOS 50 53 53 53 49  BILITOT 0.8 0.8 0.9 0.5 0.9  PROT 5.8* 6.4* 6.2* 6.4* 6.2*  ALBUMIN 2.4* 2.8* 2.5* 2.6* 2.6*   No results for input(s): LIPASE, AMYLASE in the last 168 hours. No results for input(s): AMMONIA in the last 168 hours. Coagulation Profile: No results for input(s): INR, PROTIME in the last 168 hours. Cardiac Enzymes: No results for input(s): CKTOTAL, CKMB, CKMBINDEX, TROPONINI in the last 168 hours. BNP (last 3 results) No results for input(s): PROBNP in the last 8760 hours. HbA1C: No results for input(s): HGBA1C in the last 72 hours. CBG: Recent Labs  Lab 09/08/19 1151 09/08/19 1605 09/08/19 2119 09/09/19 0822 09/09/19 1134  GLUCAP 247* 234* 213* 102* 186*   Lipid Profile: No results for input(s): CHOL, HDL, LDLCALC, TRIG, CHOLHDL, LDLDIRECT in the last 72 hours. Thyroid Function Tests: No results for input(s): TSH, T4TOTAL, FREET4, T3FREE, THYROIDAB in the last 72 hours. Anemia Panel: Recent Labs    09/08/19 0255 09/09/19 0500  FERRITIN 297 239   Sepsis Labs: Recent Labs  Lab 09/03/19 0412 09/04/19 1602 09/04/19 2236 09/05/19 0406 09/06/19 0751  PROCALCITON 0.40 <0.10  --  <0.10 <0.10  LATICACIDVEN  --  2.9* 1.9  --   --     Recent Results (from the past 240 hour(s))  Blood culture (routine x 2)      Status: None   Collection Time: 09/01/19  5:56 PM   Specimen: BLOOD  Result Value Ref Range Status   Specimen Description BLOOD RIGHT ANTECUBITAL  Final  Special Requests   Final    BOTTLES DRAWN AEROBIC AND ANAEROBIC Blood Culture adequate volume   Culture   Final    NO GROWTH 5 DAYS Performed at [facility], [address], [place], [place] [address]    Report Status 09/06/2019 FINAL  Final  SARS Coronavirus 2 by RT PCR (hospital order, performed in [facility] hospital lab) Nasopharyngeal Nasopharyngeal Swab     Status: Abnormal   Collection Time: 09/01/19  9:58 PM   Specimen: Nasopharyngeal Swab  Result Value Ref Range Status   SARS Coronavirus 2 POSITIVE (A) NEGATIVE Final    Comment: RESULT CALLED TO, READ BACK BY AND VERIFIED WITH: J SAWAPZKI RN 09/01/19 2358 JDW (NOTE) SARS-CoV-2 target nucleic acids are DETECTED  SARS-CoV-2 RNA is generally detectable in upper respiratory specimens  during the acute phase of infection.  Positive results are indicative  of the presence of the identified virus, but do not rule out bacterial infection or co-infection with other pathogens not detected by the test.  Clinical correlation with patient history and  other diagnostic information is necessary to determine patient infection status.  The expected result is negative.  Fact Sheet for Patients:   BoilerBrush.com.cy   Fact Sheet for Healthcare Providers:   https://pope.com/    This test is not yet approved or cleared by the [place] FDA and  has been authorized for detection and/or diagnosis of SARS-CoV-2 by FDA under an Emergency Use Authorization (EUA).  This EUA will remain in effect (meaning this test ca n be used) for the duration of  the COVID-19 declaration under Section 564(b)(1) of the Act, 21 U.S.C. section 360-bbb-3(b)(1), unless the authorization is terminated or revoked sooner.  Performed at [facility]  Lab, [address] [place], [place] [address]   Blood culture (routine x 2)     Status: None   Collection Time: 09/01/19 10:02 PM   Specimen: BLOOD  Result Value Ref Range Status   Specimen Description BLOOD SITE NOT SPECIFIED  Final   Special Requests   Final    BOTTLES DRAWN AEROBIC AND ANAEROBIC Blood Culture adequate volume   Culture   Final    NO GROWTH 5 DAYS Performed at [facility], [address], [place], [place] [address]    Report Status 09/06/2019 FINAL  Final  Respiratory Panel by PCR     Status: None   Collection Time: 09/03/19 11:16 PM   Specimen: Nasopharyngeal Swab; Respiratory  Result Value Ref Range Status   Adenovirus NOT DETECTED NOT DETECTED Final   Coronavirus 229E NOT DETECTED NOT DETECTED Final    Comment: (NOTE) The Coronavirus on the Respiratory Panel, DOES NOT test for the novel  Coronavirus (2019 nCoV)    Coronavirus HKU1 NOT DETECTED NOT DETECTED Final   Coronavirus NL63 NOT DETECTED NOT DETECTED Final   Coronavirus OC43 NOT DETECTED NOT DETECTED Final   Metapneumovirus NOT DETECTED NOT DETECTED Final   Rhinovirus / Enterovirus NOT DETECTED NOT DETECTED Final   Influenza A NOT DETECTED NOT DETECTED Final   Influenza B NOT DETECTED NOT DETECTED Final   Parainfluenza Virus 1 NOT DETECTED NOT DETECTED Final   Parainfluenza Virus 2 NOT DETECTED NOT DETECTED Final   Parainfluenza Virus 3 NOT DETECTED NOT DETECTED Final   Parainfluenza Virus 4 NOT DETECTED NOT DETECTED Final   Respiratory Syncytial Virus NOT DETECTED NOT DETECTED Final   Bordetella pertussis NOT DETECTED NOT DETECTED Final   Chlamydophila pneumoniae NOT DETECTED  NOT DETECTED Final   Mycoplasma pneumoniae NOT DETECTED NOT DETECTED Final    Comment: Performed at [facility], [address] [place], [place] [address]         Radiology Studies: CT ANGIO CHEST PE W OR WO CONTRAST  Result Date: 09/07/2019 CLINICAL DATA:  COVID positive, shortness of breath, rule out PE  EXAM: CT ANGIOGRAPHY CHEST WITH CONTRAST TECHNIQUE: Multidetector CT imaging of the chest was performed using the standard protocol during bolus administration of intravenous contrast. Multiplanar CT image reconstructions and MIPs were obtained to evaluate the vascular anatomy. CONTRAST:  75mL OMNIPAQUE IOHEXOL 350 MG/ML SOLN COMPARISON:  None. FINDINGS: Cardiovascular: Satisfactory opacification of the pulmonary arteries to the segmental level. No evidence of pulmonary embolism. Normal heart size. Left coronary artery calcifications. No pericardial effusion. Mediastinum/Nodes: No enlarged mediastinal, hilar, or axillary lymph nodes. Thyroid gland, trachea, and esophagus demonstrate no significant findings. Lungs/Pleura: Very extensive bilateral, somewhat geographic heterogeneous and ground-glass airspace opacity throughout the lungs. No pleural effusion or pneumothorax. Upper Abdomen: No acute abnormality. Musculoskeletal: No chest wall abnormality. No acute or significant osseous findings. Review of the MIP images confirms the above findings. IMPRESSION: 1. Negative examination for pulmonary embolism. 2. Very extensive bilateral, somewhat geographic heterogeneous and ground-glass airspace opacity throughout the lungs, consistent with COVID-19 airspace disease. 3. Coronary artery disease. Electronically Signed   By: Lauralyn PrimesAlex  Bibbey M.D.   On: 09/07/2019 18:29   VAS US LOWER EXTREMITY VENOUS (DVT)  Result Date: 09/08/2019  Lower Venous DVTStudy Other Indications: Covid positive, D-dimer trending up evaluate for DVT. Comparison Study: no prior Performing Technologist: Blanch MediaMegan Riddle RVS  Examination Guidelines: A complete evaluation includes B-mode imaging, spectral Doppler, color Doppler, and power Doppler as needed of all accessible portions of each vessel. Bilateral testing is considered an integral part of a complete examination. Limited examinations for reoccurring indications may be performed as noted. The reflux  portion of the exam is performed with the patient in reverse Trendelenburg.  +---------+---------------+---------+-----------+----------+--------------+ RIGHT    CompressibilityPhasicitySpontaneityPropertiesThrombus Aging +---------+---------------+---------+-----------+----------+--------------+ CFV      Full           Yes      Yes                                 +---------+---------------+---------+-----------+----------+--------------+ SFJ      Full                                                        +---------+---------------+---------+-----------+----------+--------------+ FV Prox  Full                                                        +---------+---------------+---------+-----------+----------+--------------+ FV Mid   Full                                                        +---------+---------------+---------+-----------+----------+--------------+ FV DistalFull                                                        +---------+---------------+---------+-----------+----------+--------------+  PFV      Full                                                        +---------+---------------+---------+-----------+----------+--------------+ POP      Full           Yes      Yes                                 +---------+---------------+---------+-----------+----------+--------------+ PTV      Full                                                        +---------+---------------+---------+-----------+----------+--------------+ PERO     Full                                                        +---------+---------------+---------+-----------+----------+--------------+   +---------+---------------+---------+-----------+----------+--------------+ LEFT     CompressibilityPhasicitySpontaneityPropertiesThrombus Aging +---------+---------------+---------+-----------+----------+--------------+ CFV      Full           Yes      Yes                                  +---------+---------------+---------+-----------+----------+--------------+ SFJ      Full                                                        +---------+---------------+---------+-----------+----------+--------------+ FV Prox  Full                                                        +---------+---------------+---------+-----------+----------+--------------+ FV Mid   Full                                                        +---------+---------------+---------+-----------+----------+--------------+ FV DistalFull                                                        +---------+---------------+---------+-----------+----------+--------------+ PFV      Full                                                        +---------+---------------+---------+-----------+----------+--------------+   POP      Full           Yes      Yes                                 +---------+---------------+---------+-----------+----------+--------------+ PTV      Full                                                        +---------+---------------+---------+-----------+----------+--------------+ PERO     Full                                                        +---------+---------------+---------+-----------+----------+--------------+     Summary: BILATERAL: - No evidence of deep vein thrombosis seen in the lower extremities, bilaterally. - No evidence of superficial venous thrombosis in the lower extremities, bilaterally. -   *See table(s) above for measurements and observations. Electronically signed by Fabienne Bruns MD on 09/08/2019 at 5:13:44 PM.    Final         Scheduled Meds: . AeroChamber Plus Flo-Vu Large  1 each Other Once  . albuterol  2 puff Inhalation BID  . aspirin EC  81 mg Oral Daily  . dexamethasone (DECADRON) injection  10 mg Intravenous Daily  . enoxaparin (LOVENOX) injection  40 mg Subcutaneous Daily  . feeding  supplement (GLUCERNA SHAKE)  237 mL Oral BID BM  . folic acid  1,000 mcg Oral Daily  . gabapentin  300 mg Oral Daily  . gabapentin  600 mg Oral QHS  . insulin aspart  0-15 Units Subcutaneous TID WC  . insulin aspart  0-5 Units Subcutaneous QHS  . insulin aspart  8 Units Subcutaneous TID WC  . insulin detemir  14 Units Subcutaneous BID  . levothyroxine  25 mcg Oral QAC breakfast  . linagliptin  5 mg Oral Daily  . lisinopril  2.5 mg Oral Daily  . pantoprazole  40 mg Oral Daily  . rosuvastatin  10 mg Oral QPM  . sodium chloride flush  3 mL Intravenous Once  . thiamine  100 mg Oral Daily  . valACYclovir  1,000 mg Oral Daily  . zinc sulfate  220 mg Oral Daily   Continuous Infusions:    LOS: 8 days    Time spent:40 min    Daylynn Stumpp, Roselind Messier, MD [facility] Pager [phone]  If 7PM-7AM, please contact night-coverage [email or web address] Password [email or web address] 09/09/2019, 1:24 PM

## 2019-09-10 DIAGNOSIS — E7800 Pure hypercholesterolemia, unspecified: Secondary | ICD-10-CM

## 2019-09-10 DIAGNOSIS — J9601 Acute respiratory failure with hypoxia: Secondary | ICD-10-CM | POA: Diagnosis not present

## 2019-09-10 DIAGNOSIS — J189 Pneumonia, unspecified organism: Secondary | ICD-10-CM

## 2019-09-10 DIAGNOSIS — U071 COVID-19: Secondary | ICD-10-CM | POA: Diagnosis not present

## 2019-09-10 LAB — CBC WITH DIFFERENTIAL/PLATELET
Abs Immature Granulocytes: 0.29 10*3/uL — ABNORMAL HIGH (ref 0.00–0.07)
Basophils Absolute: 0 10*3/uL (ref 0.0–0.1)
Basophils Relative: 0 %
Eosinophils Absolute: 0.1 10*3/uL (ref 0.0–0.5)
Eosinophils Relative: 1 %
HCT: 48.8 % (ref 39.0–52.0)
Hemoglobin: 15.4 g/dL (ref 13.0–17.0)
Immature Granulocytes: 3 %
Lymphocytes Relative: 4 %
Lymphs Abs: 0.4 10*3/uL — ABNORMAL LOW (ref 0.7–4.0)
MCH: 28.4 pg (ref 26.0–34.0)
MCHC: 31.6 g/dL (ref 30.0–36.0)
MCV: 89.9 fL (ref 80.0–100.0)
Monocytes Absolute: 0.6 10*3/uL (ref 0.1–1.0)
Monocytes Relative: 6 %
Neutro Abs: 8.5 10*3/uL — ABNORMAL HIGH (ref 1.7–7.7)
Neutrophils Relative %: 86 %
Platelets: 561 10*3/uL — ABNORMAL HIGH (ref 150–400)
RBC: 5.43 MIL/uL (ref 4.22–5.81)
RDW: 13.5 % (ref 11.5–15.5)
WBC: 9.9 10*3/uL (ref 4.0–10.5)
nRBC: 0 % (ref 0.0–0.2)

## 2019-09-10 LAB — COMPREHENSIVE METABOLIC PANEL
ALT: 53 U/L — ABNORMAL HIGH (ref 0–44)
AST: 34 U/L (ref 15–41)
Albumin: 2.7 g/dL — ABNORMAL LOW (ref 3.5–5.0)
Alkaline Phosphatase: 53 U/L (ref 38–126)
Anion gap: 11 (ref 5–15)
BUN: 19 mg/dL (ref 6–20)
CO2: 27 mmol/L (ref 22–32)
Calcium: 9.1 mg/dL (ref 8.9–10.3)
Chloride: 97 mmol/L — ABNORMAL LOW (ref 98–111)
Creatinine, Ser: 0.76 mg/dL (ref 0.61–1.24)
GFR calc Af Amer: >60 mL/min (ref >60)
GFR calc non Af Amer: >60 mL/min (ref >60)
Glucose, Bld: 132 mg/dL — ABNORMAL HIGH (ref 70–99)
Potassium: 4.1 mmol/L (ref 3.5–5.1)
Sodium: 135 mmol/L (ref 135–145)
Total Bilirubin: 0.6 mg/dL (ref 0.3–1.2)
Total Protein: 6.5 g/dL (ref 6.5–8.1)

## 2019-09-10 LAB — LACTATE DEHYDROGENASE: LDH: 286 U/L — ABNORMAL HIGH (ref 98–192)

## 2019-09-10 LAB — GLUCOSE, CAPILLARY
Glucose-Capillary: 111 mg/dL — ABNORMAL HIGH (ref 70–99)
Glucose-Capillary: 188 mg/dL — ABNORMAL HIGH (ref 70–99)
Glucose-Capillary: 347 mg/dL — ABNORMAL HIGH (ref 70–99)
Glucose-Capillary: 250 mg/dL — ABNORMAL HIGH (ref 70–99)

## 2019-09-10 LAB — C-REACTIVE PROTEIN: CRP: 1.2 mg/dL — ABNORMAL HIGH (ref <1.0)

## 2019-09-10 LAB — PHOSPHORUS: Phosphorus: 4.2 mg/dL (ref 2.5–4.6)

## 2019-09-10 LAB — FERRITIN: Ferritin: 277 ng/mL (ref 24–336)

## 2019-09-10 LAB — D-DIMER, QUANTITATIVE: D-Dimer, Quant: 1.71 ug/mL-FEU — ABNORMAL HIGH (ref 0.00–0.50)

## 2019-09-10 LAB — MAGNESIUM: Magnesium: 1.9 mg/dL (ref 1.7–2.4)

## 2019-09-10 MED ORDER — FUROSEMIDE 10 MG/ML IJ SOLN
40.0000 mg | Freq: Four times a day (QID) | INTRAMUSCULAR | Status: AC
  Administered 2019-09-10 (×2): 40 mg via INTRAVENOUS
  Filled 2019-09-10 (×2): qty 4

## 2019-09-10 MED ORDER — ENOXAPARIN SODIUM 60 MG/0.6ML ~~LOC~~ SOLN
55.0000 mg | Freq: Every day | SUBCUTANEOUS | Status: DC
  Administered 2019-09-11 – 2019-09-16 (×6): 55 mg via SUBCUTANEOUS
  Filled 2019-09-10 (×7): qty 0.6

## 2019-09-10 NOTE — Evaluation (Signed)
Physical Therapy Evaluation Patient Details Name: Thomas Silva MRN: [id number] DOB: 1965/08/24 Today's Date: 09/10/2019   History of Present Illness  Pt is a 54 y.o. male with PMH of HLD, DM, hep B, admitted 09/01/19 with several days of SOB and cough. Covid +/no vaccine. CXR with bilateral infiltrates. Workup for hypoxic respiratory failure, COVID PNA, AKI.    Clinical Impression  Pt presents with an overall decrease in functional mobility secondary to above. PTA, pt independent, works as Investment banker, corporate, stays active. Today, pt moving well, only requiring assist for lines and SpO2 monitoring with ambulation. SpO2 >/88% on 10L O2 HFNC. Reviewed educ re: seated/standing therex, importance of mobility, activity recommendations, incentive spirometer/flutter valve use (pt able to pull 1500 mL). Pt would benefit from continued acute PT services to maximize functional mobility and independence prior to d/c home.     Follow Up Recommendations No PT follow up    Equipment Recommendations  None recommended by PT    Recommendations for Other Services       Precautions / Restrictions Precautions Precautions: Other (comment) Precaution Comments: Watch SpO2 Restrictions Weight Bearing Restrictions: No      Mobility  Bed Mobility               General bed mobility comments: Received sitting in recliner  Transfers Overall transfer level: Independent Equipment used: None Transfers: Sit to/from Stand              Ambulation/Gait Ambulation/Gait assistance: Supervision Gait Distance (Feet): 500 Feet Assistive device: None Gait Pattern/deviations: Step-through pattern;Decreased stride length   Gait velocity interpretation: 1.31 - 2.62 ft/sec, indicative of limited community ambulator General Gait Details: Steady gait, able to push O2 tank, assist for line management and SpO2 monitoring. SpO2 >/88% on 10L O2, HR 80  Stairs            Wheelchair Mobility    Modified  Rankin (Stroke Patients Only)       Balance Overall balance assessment: Needs assistance   Sitting balance-Leahy Scale: Good       Standing balance-Leahy Scale: Good                               Pertinent Vitals/Pain Pain Assessment: No/denies pain    Home Living Family/patient expects to be discharged to:: Private residence Living Arrangements: Alone Available Help at Discharge: Family;Friend(s);Available 24 hours/day Type of Home: House Home Access: Stairs to enter Entrance Stairs-Rails: Right;Left;Can reach both Entrance Stairs-Number of Steps: 2 Home Layout: One level Home Equipment: Walker - 2 wheels;Cane - single point;Wheelchair - manual;Hand held shower head;Grab bars - tub/shower      Prior Function Level of Independence: Independent         Comments: works as Investment banker, corporate; runs a farm; 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: Overall WFL for tasks assessed    Lower Extremity Assessment Lower Extremity Assessment: Overall WFL for tasks assessed    Cervical / Trunk Assessment Cervical / Trunk Assessment: Normal  Communication   Communication: No difficulties  Cognition Arousal/Alertness: Awake/alert Behavior During Therapy: WFL for tasks assessed/performed Overall Cognitive Status: Within Functional Limits for tasks assessed  General Comments      Exercises Other Exercises Other Exercises: Repeated sit<>stand, seated LAQ, seated marching Other Exercises: Incentive spirometer x5 (1500 mL), flutter valve x5 -- great technqiue with both   Assessment/Plan    PT Assessment Patient needs continued PT services  PT Problem List Decreased activity tolerance;Decreased mobility;Cardiopulmonary status limiting activity       PT Treatment Interventions Gait training;Stair training;Functional  mobility training;Therapeutic activities;Therapeutic exercise;Patient/family education    PT Goals (Current goals can be found in the Care Plan section)  Acute Rehab PT Goals Patient Stated Goal: to get better and go home PT Goal Formulation: With patient Time For Goal Achievement: 09/24/19 Potential to Achieve Goals: Good    Frequency Min 3X/week   Barriers to discharge        Co-evaluation               AM-PAC PT "6 Clicks" Mobility  Outcome Measure Help needed turning from your back to your side while in a flat bed without using bedrails?: None Help needed moving from lying on your back to sitting on the side of a flat bed without using bedrails?: None Help needed moving to and from a bed to a chair (including a wheelchair)?: None Help needed standing up from a chair using your arms (e.g., wheelchair or bedside chair)?: None Help needed to walk in hospital room?: None Help needed climbing 3-5 steps with a railing? : A Little 6 Click Score: 23    End of Session Equipment Utilized During Treatment: Oxygen Activity Tolerance: Patient tolerated treatment well Patient left: in chair;with call bell/phone within reach Nurse Communication: Mobility status PT Visit Diagnosis: Other abnormalities of gait and mobility (R26.89)    Time: 4098-1191 PT Time Calculation (min) (ACUTE ONLY): 24 min   Charges:   PT Evaluation $PT Eval Moderate Complexity: 1 Mod PT Treatments $Therapeutic Exercise: 8-22 mins      Ina Homes, PT, DPT Acute Rehabilitation Services  Pager [phone] Office [phone]  Malachy Chamber 09/10/2019, 5:22 PM

## 2019-09-10 NOTE — Plan of Care (Signed)
  Problem: Education: Goal: Knowledge of risk factors and measures for prevention of condition will improve Outcome: Progressing   Problem: Coping: Goal: Psychosocial and spiritual needs will be supported Outcome: Progressing   Problem: Education: Goal: Knowledge of General Education information will improve Description: Including pain rating scale, medication(s)/side effects and non-pharmacologic comfort measures Outcome: Progressing   Problem: Activity: Goal: Risk for activity intolerance will decrease Outcome: Progressing

## 2019-09-10 NOTE — Progress Notes (Addendum)
Occupational Therapy Evaluation Patient Details Name: Thomas Silva MRN: [id number] DOB: 08-08-1965 Today's Date: 09/10/2019    History of Present Illness 54 yo with hx of HLD, DM and hep B Pt with several days of SOB and cough. Covid +/no vaccine. CXR B infiltrates. Hypoxic respiratory failure; Covid pneumonia; AKI.   Clinical Impression   PTA, pt lived alone, worked as a Investment banker, corporate and on a farm and was independent with ADL, IADL and mobility. Pt states he is anxious about moving but was able to ambulate @ 60 ft with 1 seated rest break on 6L HFNC with SpO2 desat to 86. Educated pt on pursed lip breathing and encouraged relaxation strategies. Educated pt on proper use of incentive spirometer - pt able to pull @ 1250 ml. Will follow acutely to facilitate safe DC home. Do not anticipate need for HHOT after DC. Pt states he has family/friends who can assist at DC. Encourage pt to ambulate with staff.      Follow Up Recommendations  No OT follow up;Supervision - Intermittent    Equipment Recommendations  None recommended by OT    Recommendations for Other Services       Precautions / Restrictions Precautions Precaution Comments: watch      Mobility Bed Mobility Overal bed mobility: Modified Independent                Transfers Overall transfer level: Needs assistance   Transfers: Sit to/from Stand Sit to Stand: Supervision         General transfer comment: unsteady at times    Balance Overall balance assessment: Needs assistance   Sitting balance-Leahy Scale: Good       Standing balance-Leahy Scale: Fair                             ADL either performed or assessed with clinical judgement   ADL Overall ADL's : Needs assistance/impaired     Grooming: Set up;Standing   Upper Body Bathing: Set up;Sitting   Lower Body Bathing: Set up;Sit to/from stand   Upper Body Dressing : Set up;Sitting   Lower Body Dressing: Set up;Sit to/from  stand   Toilet Transfer: Supervision/safety;Ambulation   Toileting- Clothing Manipulation and Hygiene: Modified independent       Functional mobility during ADLs: Supervision/safety General ADL Comments: Able to complete self care however would benefit form energy conservation education     Vision Baseline Vision/History: No visual deficits Patient Visual Report: No change from baseline       Perception     Praxis      Pertinent Vitals/Pain Pain Assessment: No/denies pain     Hand Dominance Right   Extremity/Trunk Assessment Upper Extremity Assessment Upper Extremity Assessment: Overall WFL for tasks assessed   Lower Extremity Assessment Lower Extremity Assessment: Defer to PT evaluation       Communication Communication Communication: No difficulties   Cognition Arousal/Alertness: Awake/alert Behavior During Therapy: WFL for tasks assessed/performed Overall Cognitive Status: Within Functional Limits for tasks assessed                                     General Comments  enjoys watching Youtube and eating    Exercises Exercises: Other exercises Other Exercises Other Exercises: incentive spirometer x 10 - able to pull 1250 ml Other Exercises: flutter valve x 10   Shoulder Instructions  Home Living Family/patient expects to be discharged to:: Private residence Living Arrangements: Alone Available Help at Discharge: Friend(s);Available 24 hours/day;Available PRN/intermittently;Family Type of Home: House Home Access: Stairs to enter [employer] of Steps: 2 Entrance Stairs-Rails: Right;Left;Can reach both Home Layout: One level     Bathroom Shower/Tub: Producer, television/film/video: Handicapped height Bathroom Accessibility: Yes How Accessible: Accessible via walker Home Equipment: Walker - 2 wheels;Cane - single point;Wheelchair - manual;Hand held shower head;Grab bars - tub/shower          Prior  Functioning/Environment Level of Independence: Independent        Comments: works as Investment banker, corporate; runs a farm; very active        OT Problem List: Decreased strength;Decreased activity tolerance;Cardiopulmonary status limiting activity      OT Treatment/Interventions: Environmental manager;Therapeutic exercise;Energy conservation;DME and/or AE instruction;Therapeutic activities;Patient/family education    OT Goals(Current goals can be found in the care plan section) Acute Rehab OT Goals Patient Stated Goal: to get better and go home OT Goal Formulation: With patient Time For Goal Achievement: 09/24/19 Potential to Achieve Goals: Good  OT Frequency: Min 2X/week   Barriers to D/C:            Co-evaluation              AM-PAC OT "6 Clicks" Daily Activity     Outcome Measure Help from another person eating meals?: None Help from another person taking care of personal grooming?: A Little Help from another person toileting, which includes using toliet, bedpan, or urinal?: A Little Help from another person bathing (including washing, rinsing, drying)?: A Little Help from another person to put on and taking off regular upper body clothing?: A Little Help from another person to put on and taking off regular lower body clothing?: A Little 6 Click Score: 19   End of Session Equipment Utilized During Treatment: Oxygen (ambulated on 6L) Nurse Communication: Mobility status  Activity Tolerance: Patient tolerated treatment well Patient left: in chair;with call bell/phone within reach  OT Visit Diagnosis: Unsteadiness on feet (R26.81)                Time: 9983-3825 OT Time Calculation (min): 26 min Charges:  OT General Charges $OT Visit: 1 Visit OT Evaluation $OT Eval Moderate Complexity: 1 Mod OT Treatments $Self Care/Home Management : 8-22 mins  Luisa Dago, OT/L   Acute OT Clinical Specialist Acute Rehabilitation Services Pager [phone] Office  [phone]   [facility] 09/10/2019, 1:17 PM

## 2019-09-10 NOTE — Progress Notes (Signed)
PROGRESS NOTE    Thomas BlankVincent Cribb  UJW:[id number] DOB: 04/19/1965 DOA: 09/01/2019 PCP: Casper HarrisonStreet, Stephanie Couphristopher M, MD     Brief Narrative:   Thomas Silva is a 54 y.o. WM PMHx HLD.  Pt with several day h/o SOB and cough.  Presented to PCP's office today.  Tested COVID positive.  Pt has not had vaccine.   ED Course: Tm 102.7, CXR with bilateral infiltrates C/W COVID.  Desats to 87% on RA, currently satting well but on 4L via [place].   Subjective:  He remains on 5 L oxygen, reports his dyspnea has improved, no nausea, no vomiting, still reports some cough and generalized weakness.   Assessment & Plan: Covid vaccination; no vaccination   Active Problems:   Acute hypoxemic respiratory failure due to COVID-19 [facility](HCC)   Pneumonia due to COVID-19 virus   Community acquired pneumonia   HLD (hyperlipidemia)   Hypothyroidism   AKI (acute kidney injury) (HCC)  Acute respiratory failure with hypoxia/Covid 19 pneumonia -Patient imaging significant for multifocal opacity, with significant oxygen requirement at one point requiring 13 L heated high flow nasal cannula, this has been improving, he is on 5 L today. -He was encouraged to use incentive spirometry, flutter valve, he was encouraged to get out of bed to chair, he will ambulate with staff today. -Treated with IV remdesivir. -Being treated with IV steroids. -Patient with history of hepatitis B, so he did not receive Actemra. -8/5 transfuse 1 unit Covid Convalescent Plasma -8/8 patient's D-dimer trending up again will obtain CTA PE protocol and bilateral lower extremity Doppler to rule out PE/DVT -8/9 CTA PE protocol negative for PE see results below -8/10 bilateral lower extremity Doppler negative see results below -We will give Lasix 40 mg IV x2 today   COVID-19 Labs  Recent Labs    09/08/19 0255 09/09/19 0500 09/10/19 0123  DDIMER 1.87* 1.79* 1.71*  FERRITIN 297 239 277  LDH 316* 347* 286*  CRP 6.2* 1.8* 1.2*    Lab Results   Component Value Date   SARSCOV2NAA POSITIVE (A) 09/01/2019   CAP -He was treated with IV Rocephin and azithromycin Hyperlipidemia --Crestor 10 mg daily  Hypothyroidism -Synthroid 25 mcg daily  DM type II uncontrolled with complication -8/4 hemoglobin A1c= 8.5 -Currently acceptable on current dose of Levemir, premeal NovoLog and sliding scale   AKI (baseline Cr 1.0) Recent Labs  Lab 09/06/19 0751 09/07/19 0639 09/08/19 0255 09/09/19 0500 09/10/19 0123  CREATININE 0.72 0.69 0.91 0.71 0.76  -At baseline   GERD -Continue with PPI    DVT prophylaxis: Lovenox Code Status: Full Family Communication: I have updated patient Status is: Inpatient    Dispo: The patient is from: Home              Anticipated d/c is to: Home              Anticipated d/c date is: 8/16              Patient currently unstable      Consultants:    Procedures/Significant Events negative DVT 8/2 PCXR;Bilateral streaky densities most consistent with COVID-19 Infiltrate. 8/8 CTA chest PE protocol;-negative PE 2. Very extensive bilateral, somewhat geographic heterogeneous and ground-glass airspace opacity throughout the lungs, consistent with COVID-19 airspace disease. 3. Coronary artery disease. 8/9 lower extremity Doppler; negative DVT   I have personally reviewed and interpreted all radiology studies and my findings are as above.  VENTILATOR SETTINGS: HFNC 8/10  flow; 5 L/min SPO2; 91%  Cultures 8/2 SARS coronavirus positive 8/2 HIV screen negative 8/2 blood RIGHT AC NGTD 8/2 blood NGTD 8/4 virus panel negative   Antimicrobials: Anti-infectives (From admission, onward)   Start     Ordered Stop   09/03/19 1000  remdesivir 100 mg in sodium chloride 0.9 % 100 mL IVPB     Discontinue    "Followed by" Linked Group Details   09/01/19 2335 09/07/19 0959   09/02/19 2200  azithromycin (ZITHROMAX) 500 mg in sodium chloride 0.9 % 250 mL IVPB     Discontinue     09/02/19  0832 09/06/19 2159   09/02/19 2200  cefTRIAXone (ROCEPHIN) 1 g in sodium chloride 0.9 % 100 mL IVPB     Discontinue     09/02/19 0832     09/02/19 1600  valACYclovir (VALTREX) tablet 1,000 mg     Discontinue     09/02/19 1510     09/02/19 0100  remdesivir 200 mg in sodium chloride 0.9% 250 mL IVPB       "Followed by" Linked Group Details   09/01/19 2335 09/02/19 0119   09/01/19 2200  azithromycin (ZITHROMAX) 500 mg in sodium chloride 0.9 % 250 mL IVPB        09/01/19 2158 09/02/19 0033   09/01/19 2200  cefTRIAXone (ROCEPHIN) 1 g in sodium chloride 0.9 % 100 mL IVPB        09/01/19 2158 09/01/19 2306       Devices    LINES / TUBES:      Continuous Infusions:    Objective: Vitals:   09/09/19 2025 09/10/19 0426 09/10/19 0800 09/10/19 0819  BP: 128/71 102/72  114/67  Pulse: 76 65    Resp: 20 20    Temp: 98.3 F (36.8 C) 97.8 F (36.6 C)    TempSrc: Oral Oral    SpO2: 93% 93% 92%   Weight:      Height:        Intake/Output Summary (Last 24 hours) at 09/10/2019 1222 Last data filed at 09/10/2019 0426 Gross per 24 hour  Intake 60 ml  Output 1401 ml  Net -1341 ml   Filed Weights   09/01/19 1805 09/05/19 1938  Weight: 108.9 kg 108.9 kg   Physical Exam:  Awake Alert, Oriented X 3, No new F.N deficits, Normal affect Symmetrical Chest wall movement, Good air movement bilaterally, CTAB RRR,No Gallops,Rubs or new Murmurs, No Parasternal Heave +ve B.Sounds, Abd Soft, No tenderness, No rebound - guarding or rigidity. No Cyanosis, Clubbing , he has trace edema edema, No new Rash or bruise     CBC: Recent Labs  Lab 09/06/19 0751 09/07/19 0639 09/08/19 0255 09/09/19 0500 09/10/19 0123  WBC 8.2 7.5 7.6 7.9 9.9  NEUTROABS 7.3 6.5 6.4 6.3 8.5*  HGB 15.0 14.7 14.4 15.6 15.4  HCT 48.2 46.9 45.9 49.1 48.8  MCV 90.6 90.2 90.4 91.9 89.9  PLT 277 343 446* 485* 561*   Basic Metabolic Panel: Recent Labs  Lab 09/06/19 0751 09/07/19 0639 09/08/19 0255 09/09/19 0500  09/10/19 0123  NA 138 139 137 136 135  K 3.9 4.0 4.1 4.7 4.1  CL 102 101 100 99 97*  CO2 24 24 27 24 27   GLUCOSE 81 99 148* 114* 132*  BUN 18 18 17 20 19   CREATININE 0.72 0.69 0.91 0.71 0.76  CALCIUM 8.5* 8.6* 8.6* 8.8* 9.1  MG 2.2 2.1 2.1 2.0 1.9  PHOS 2.9 3.2 3.6 4.0 4.2   GFR: Estimated Creatinine Clearance: 132.4 mL/min (  by C-G formula based on SCr of 0.76 mg/dL). Liver Function Tests: Recent Labs  Lab 09/06/19 0751 09/07/19 0639 09/08/19 0255 09/09/19 0500 09/10/19 0123  AST 53* 40 40 35 34  ALT 46* 46* 49* 48* 53*  ALKPHOS 53 53 53 49 53  BILITOT 0.8 0.9 0.5 0.9 0.6  PROT 6.4* 6.2* 6.4* 6.2* 6.5  ALBUMIN 2.8* 2.5* 2.6* 2.6* 2.7*   No results for input(s): LIPASE, AMYLASE in the last 168 hours. No results for input(s): AMMONIA in the last 168 hours. Coagulation Profile: No results for input(s): INR, PROTIME in the last 168 hours. Cardiac Enzymes: No results for input(s): CKTOTAL, CKMB, CKMBINDEX, TROPONINI in the last 168 hours. BNP (last 3 results) No results for input(s): PROBNP in the last 8760 hours. HbA1C: No results for input(s): HGBA1C in the last 72 hours. CBG: Recent Labs  Lab 09/09/19 0822 09/09/19 1134 09/09/19 1540 09/09/19 2109 09/10/19 0723  GLUCAP 102* 186* 310* 152* 111*   Lipid Profile: No results for input(s): CHOL, HDL, LDLCALC, TRIG, CHOLHDL, LDLDIRECT in the last 72 hours. Thyroid Function Tests: No results for input(s): TSH, T4TOTAL, FREET4, T3FREE, THYROIDAB in the last 72 hours. Anemia Panel: Recent Labs    09/09/19 0500 09/10/19 0123  FERRITIN 239 277   Sepsis Labs: Recent Labs  Lab 09/04/19 1602 09/04/19 2236 09/05/19 0406 09/06/19 0751  PROCALCITON <0.10  --  <0.10 <0.10  LATICACIDVEN 2.9* 1.9  --   --     Recent Results (from the past 240 hour(s))  Blood culture (routine x 2)     Status: None   Collection Time: 09/01/19  5:56 PM   Specimen: BLOOD  Result Value Ref Range Status   Specimen Description BLOOD  RIGHT ANTECUBITAL  Final   Special Requests   Final    BOTTLES DRAWN AEROBIC AND ANAEROBIC Blood Culture adequate volume   Culture   Final    NO GROWTH 5 DAYS Performed at [facility] Lab, [address], [place], [place] [address]    Report Status 09/06/2019 FINAL  Final  SARS Coronavirus 2 by RT PCR (hospital order, performed in [facility] hospital lab) Nasopharyngeal Nasopharyngeal Swab     Status: Abnormal   Collection Time: 09/01/19  9:58 PM   Specimen: Nasopharyngeal Swab  Result Value Ref Range Status   SARS Coronavirus 2 POSITIVE (A) NEGATIVE Final    Comment: RESULT CALLED TO, READ BACK BY AND VERIFIED WITH: J SAWAPZKI RN 09/01/19 2358 JDW (NOTE) SARS-CoV-2 target nucleic acids are DETECTED  SARS-CoV-2 RNA is generally detectable in upper respiratory specimens  during the acute phase of infection.  Positive results are indicative  of the presence of the identified virus, but do not rule out bacterial infection or co-infection with other pathogens not detected by the test.  Clinical correlation with patient history and  other diagnostic information is necessary to determine patient infection status.  The expected result is negative.  Fact Sheet for Patients:   BoilerBrush.com.cy   Fact Sheet for Healthcare Providers:   https://pope.com/    This test is not yet approved or cleared by the [place] FDA and  has been authorized for detection and/or diagnosis of SARS-CoV-2 by FDA under an Emergency Use Authorization (EUA).  This EUA will remain in effect (meaning this test ca n be used) for the duration of  the COVID-19 declaration under Section 564(b)(1) of the Act, 21 U.S.C. section 360-bbb-3(b)(1), unless the authorization is terminated or revoked sooner.  Performed at  [facility], [address] [place], [place], [place] [address]   Blood culture (routine x 2)     Status: None   Collection Time: 09/01/19 10:02 PM     Specimen: BLOOD  Result Value Ref Range Status   Specimen Description BLOOD SITE NOT SPECIFIED  Final   Special Requests   Final    BOTTLES DRAWN AEROBIC AND ANAEROBIC Blood Culture adequate volume   Culture   Final    NO GROWTH 5 DAYS Performed at [facility], [address], [place], [place] [address]    Report Status 09/06/2019 FINAL  Final  Respiratory Panel by PCR     Status: None   Collection Time: 09/03/19 11:16 PM   Specimen: Nasopharyngeal Swab; Respiratory  Result Value Ref Range Status   Adenovirus NOT DETECTED NOT DETECTED Final   Coronavirus 229E NOT DETECTED NOT DETECTED Final    Comment: (NOTE) The Coronavirus on the Respiratory Panel, DOES NOT test for the novel  Coronavirus (2019 nCoV)    Coronavirus HKU1 NOT DETECTED NOT DETECTED Final   Coronavirus NL63 NOT DETECTED NOT DETECTED Final   Coronavirus OC43 NOT DETECTED NOT DETECTED Final   Metapneumovirus NOT DETECTED NOT DETECTED Final   Rhinovirus / Enterovirus NOT DETECTED NOT DETECTED Final   Influenza A NOT DETECTED NOT DETECTED Final   Influenza B NOT DETECTED NOT DETECTED Final   Parainfluenza Virus 1 NOT DETECTED NOT DETECTED Final   Parainfluenza Virus 2 NOT DETECTED NOT DETECTED Final   Parainfluenza Virus 3 NOT DETECTED NOT DETECTED Final   Parainfluenza Virus 4 NOT DETECTED NOT DETECTED Final   Respiratory Syncytial Virus NOT DETECTED NOT DETECTED Final   Bordetella pertussis NOT DETECTED NOT DETECTED Final   Chlamydophila pneumoniae NOT DETECTED NOT DETECTED Final   Mycoplasma pneumoniae NOT DETECTED NOT DETECTED Final    Comment: Performed at [facility] Lab, [address] [place], [place] [address]         Radiology Studies: VAS [place] LOWER EXTREMITY VENOUS (DVT)  Result Date: 09/08/2019  Lower Venous DVTStudy Other Indications: Covid positive, D-dimer trending up evaluate for DVT. Comparison Study: no prior Performing Technologist: Blanch Media RVS  Examination Guidelines: A  complete evaluation includes B-mode imaging, spectral Doppler, color Doppler, and power Doppler as needed of all accessible portions of each vessel. Bilateral testing is considered an integral part of a complete examination. Limited examinations for reoccurring indications may be performed as noted. The reflux portion of the exam is performed with the patient in reverse Trendelenburg.  +---------+---------------+---------+-----------+----------+--------------+ RIGHT    CompressibilityPhasicitySpontaneityPropertiesThrombus Aging +---------+---------------+---------+-----------+----------+--------------+ CFV      Full           Yes      Yes                                 +---------+---------------+---------+-----------+----------+--------------+ SFJ      Full                                                        +---------+---------------+---------+-----------+----------+--------------+ FV Prox  Full                                                        +---------+---------------+---------+-----------+----------+--------------+  FV Mid   Full                                                        +---------+---------------+---------+-----------+----------+--------------+ FV DistalFull                                                        +---------+---------------+---------+-----------+----------+--------------+ PFV      Full                                                        +---------+---------------+---------+-----------+----------+--------------+ POP      Full           Yes      Yes                                 +---------+---------------+---------+-----------+----------+--------------+ PTV      Full                                                        +---------+---------------+---------+-----------+----------+--------------+ PERO     Full                                                         +---------+---------------+---------+-----------+----------+--------------+   +---------+---------------+---------+-----------+----------+--------------+ LEFT     CompressibilityPhasicitySpontaneityPropertiesThrombus Aging +---------+---------------+---------+-----------+----------+--------------+ CFV      Full           Yes      Yes                                 +---------+---------------+---------+-----------+----------+--------------+ SFJ      Full                                                        +---------+---------------+---------+-----------+----------+--------------+ FV Prox  Full                                                        +---------+---------------+---------+-----------+----------+--------------+ FV Mid   Full                                                        +---------+---------------+---------+-----------+----------+--------------+  FV DistalFull                                                        +---------+---------------+---------+-----------+----------+--------------+ PFV      Full                                                        +---------+---------------+---------+-----------+----------+--------------+ POP      Full           Yes      Yes                                 +---------+---------------+---------+-----------+----------+--------------+ PTV      Full                                                        +---------+---------------+---------+-----------+----------+--------------+ PERO     Full                                                        +---------+---------------+---------+-----------+----------+--------------+     Summary: BILATERAL: - No evidence of deep vein thrombosis seen in the lower extremities, bilaterally. - No evidence of superficial venous thrombosis in the lower extremities, bilaterally. -   *See table(s) above for measurements and observations. Electronically signed by  Fabienne Bruns MD on 09/08/2019 at 5:13:44 PM.    Final         Scheduled Meds: . AeroChamber Plus Flo-Vu Large  1 each Other Once  . albuterol  2 puff Inhalation BID  . aspirin EC  81 mg Oral Daily  . dexamethasone (DECADRON) injection  10 mg Intravenous Daily  . enoxaparin (LOVENOX) injection  40 mg Subcutaneous Daily  . feeding supplement (GLUCERNA SHAKE)  237 mL Oral BID BM  . folic acid  1,000 mcg Oral Daily  . gabapentin  300 mg Oral Daily  . gabapentin  600 mg Oral QHS  . insulin aspart  0-15 Units Subcutaneous TID WC  . insulin aspart  0-5 Units Subcutaneous QHS  . insulin aspart  8 Units Subcutaneous TID WC  . insulin detemir  14 Units Subcutaneous BID  . levothyroxine  25 mcg Oral QAC breakfast  . linagliptin  5 mg Oral Daily  . lisinopril  2.5 mg Oral Daily  . pantoprazole  40 mg Oral Daily  . rosuvastatin  10 mg Oral QPM  . sodium chloride flush  3 mL Intravenous Once  . thiamine  100 mg Oral Daily  . valACYclovir  1,000 mg Oral Daily  . zinc sulfate  220 mg Oral Daily   Continuous Infusions:    LOS: 9 days     Huey Bienenstock, MD [facility]  If 7PM-7AM, please contact night-coverage [email or web address] Password [facility] 09/10/2019, 12:22 PM

## 2019-09-11 DIAGNOSIS — J189 Pneumonia, unspecified organism: Secondary | ICD-10-CM | POA: Diagnosis not present

## 2019-09-11 DIAGNOSIS — E039 Hypothyroidism, unspecified: Secondary | ICD-10-CM

## 2019-09-11 DIAGNOSIS — U071 COVID-19: Secondary | ICD-10-CM | POA: Diagnosis not present

## 2019-09-11 DIAGNOSIS — J9601 Acute respiratory failure with hypoxia: Secondary | ICD-10-CM | POA: Diagnosis not present

## 2019-09-11 LAB — GLUCOSE, CAPILLARY
Glucose-Capillary: 112 mg/dL — ABNORMAL HIGH (ref 70–99)
Glucose-Capillary: 282 mg/dL — ABNORMAL HIGH (ref 70–99)
Glucose-Capillary: 308 mg/dL — ABNORMAL HIGH (ref 70–99)
Glucose-Capillary: 266 mg/dL — ABNORMAL HIGH (ref 70–99)

## 2019-09-11 LAB — CBC
HCT: 50.7 % (ref 39.0–52.0)
Hemoglobin: 15.9 g/dL (ref 13.0–17.0)
MCH: 28.3 pg (ref 26.0–34.0)
MCHC: 31.4 g/dL (ref 30.0–36.0)
MCV: 90.2 fL (ref 80.0–100.0)
Platelets: 538 10*3/uL — ABNORMAL HIGH (ref 150–400)
RBC: 5.62 MIL/uL (ref 4.22–5.81)
RDW: 13.7 % (ref 11.5–15.5)
WBC: 10 10*3/uL (ref 4.0–10.5)
nRBC: 0 % (ref 0.0–0.2)

## 2019-09-11 LAB — BASIC METABOLIC PANEL
Anion gap: 12 (ref 5–15)
BUN: 22 mg/dL — ABNORMAL HIGH (ref 6–20)
CO2: 29 mmol/L (ref 22–32)
Calcium: 9 mg/dL (ref 8.9–10.3)
Chloride: 94 mmol/L — ABNORMAL LOW (ref 98–111)
Creatinine, Ser: 1.07 mg/dL (ref 0.61–1.24)
GFR calc Af Amer: >60 mL/min (ref >60)
GFR calc non Af Amer: >60 mL/min (ref >60)
Glucose, Bld: 251 mg/dL — ABNORMAL HIGH (ref 70–99)
Potassium: 4 mmol/L (ref 3.5–5.1)
Sodium: 135 mmol/L (ref 135–145)

## 2019-09-11 LAB — D-DIMER, QUANTITATIVE: D-Dimer, Quant: 1.73 ug/mL-FEU — ABNORMAL HIGH (ref 0.00–0.50)

## 2019-09-11 MED ORDER — INSULIN ASPART 100 UNIT/ML ~~LOC~~ SOLN
10.0000 [IU] | Freq: Three times a day (TID) | SUBCUTANEOUS | Status: DC
  Administered 2019-09-11 – 2019-09-16 (×15): 10 [IU] via SUBCUTANEOUS

## 2019-09-11 MED ORDER — FUROSEMIDE 10 MG/ML IJ SOLN
40.0000 mg | Freq: Four times a day (QID) | INTRAMUSCULAR | Status: AC
  Administered 2019-09-11 (×2): 40 mg via INTRAVENOUS
  Filled 2019-09-11 (×2): qty 4

## 2019-09-11 NOTE — Progress Notes (Signed)
Results for CREIGHTON, LONGLEY (MRN [id number]) as of 09/11/2019 13:33  Ref. Range 09/10/2019 12:06 09/10/2019 16:42 09/10/2019 21:16 09/11/2019 07:30 09/11/2019 12:38  Glucose-Capillary Latest Ref Range: 70 - 99 mg/dL 979 (H) 892 (H) 119 (H) 112 (H) 282 (H)  Noted that postprandial CBGs have been greater than 200 mg/dl.  Recommend increasing Novolog meal coverage to 10 units TID if blood sugars continue to be elevated. Titrate dosages as needed.  Smith Mince RN BSN CDE Diabetes Coordinator Pager: [phone]  8am-5pm

## 2019-09-11 NOTE — Plan of Care (Signed)
  Problem: Education: Goal: Knowledge of risk factors and measures for prevention of condition will improve Outcome: Progressing   Problem: Activity: Goal: Risk for activity intolerance will decrease Outcome: Progressing  Reviewed plan of care regarding hygiene and performance of ADL's.  Problem: Nutrition: Goal: Adequate nutrition will be maintained Outcome: Progressing  Pateint plans out meals and snacks according to blood sugar levels.

## 2019-09-11 NOTE — Progress Notes (Signed)
PROGRESS NOTE    Thomas Silva  EXB:[id number] DOB: November 16, 1965 DOA: 09/01/2019 PCP: [name], [name], MD     Brief Narrative:   Thomas Silva is a 54 y.o. WM PMHx HLD.  Pt with several day h/o SOB and cough.  Presented to PCP's office today.  Tested COVID positive.  Pt has not had vaccine.   ED Course: Tm 102.7, CXR with bilateral infiltrates C/W COVID.  Desats to 87% on RA, currently satting well but on 4L via .   Subjective:  Patient remains on 4 L nasal cannula, he reports some dyspnea with activity, but report appetite has improved, still reports some cough.     Assessment & Plan: Covid vaccination; no vaccination   Active Problems:   Acute hypoxemic respiratory failure due to COVID-19 [facility])   Pneumonia due to COVID-19 virus   Community acquired pneumonia   HLD (hyperlipidemia)   Hypothyroidism   AKI (acute kidney injury) (HCC)  Acute respiratory failure with hypoxia/Covid 19 pneumonia -Patient imaging significant for multifocal opacity, with significant oxygen requirement at one point requiring 13 Lhigh flow nasal cannula, this has been improving, he has improved oxygen requirement, he is on 4 to 5 L nasal cannula at rest, but is requiring up to 10 L with activity. -He was encouraged to use incentive spirometry, flutter valve, he was encouraged to get out of bed to chair, he will ambulate with staff today. -Treated with IV remdesivir. -Being treated with IV steroids. -Patient with history of hepatitis B, so he did not receive Actemra. -8/5 transfused 1 unit Covid Convalescent Plasma -8/8 patient's D-dimer trending up again will obtain CTA PE protocol and bilateral lower extremity Doppler to rule out PE/DVT -8/9 CTA PE protocol negative for PE see results below -8/10 bilateral lower extremity Doppler negative see results below -As needed Lasix, he received 40 mg IV x2 yesterday, will repeat this dosing for today and recheck BNP in a.m.  COVID-19 Labs  Recent  Labs    09/09/19 0500 09/10/19 0123 09/11/19 0955  DDIMER 1.79* 1.71* 1.73*  FERRITIN 239 277  --   LDH 347* 286*  --   CRP 1.8* 1.2*  --     Lab Results  Component Value Date   SARSCOV2NAA POSITIVE (A) 09/01/2019   CAP -He was treated with IV Rocephin and azithromycin  Hyperlipidemia --Crestor 10 mg daily  Hypothyroidism -Synthroid 25 mcg daily  DM type II uncontrolled with complication -8/4 hemoglobin A1c= 8.5 -Currently acceptable on current dose of Levemir, premeal NovoLog and sliding scale will increase Premeal NovoLog to 10 units 3 times daily   AKI (baseline Cr 1.0) Recent Labs  Lab 09/07/19 0639 09/08/19 0255 09/09/19 0500 09/10/19 0123 09/11/19 0955  CREATININE 0.69 0.91 0.71 0.76 1.07  -At baseline   GERD -Continue with PPI    DVT prophylaxis: Lovenox Code Status: Full Family Communication: I have updated patient,  Status is: Inpatient    Dispo: The patient is from: Home              Anticipated d/c is to: Home              Anticipated d/c date is: 8/15              Patient currently unstable      Consultants:    Procedures/Significant Events negative DVT 8/2 PCXR;Bilateral streaky densities most consistent with COVID-19 Infiltrate. 8/8 CTA chest PE protocol;-negative PE 2. Very extensive bilateral, somewhat geographic heterogeneous and ground-glass airspace opacity  throughout the lungs, consistent with COVID-19 airspace disease. 3. Coronary artery disease. 8/9 lower extremity Doppler; negative DVT   I have personally reviewed and interpreted all radiology studies and my findings are as above.  VENTILATOR SETTINGS: HFNC 8/10  flow; 5 L/min SPO2; 91%    Cultures 8/2 SARS coronavirus positive 8/2 HIV screen negative 8/2 blood RIGHT AC NGTD 8/2 blood NGTD 8/4 virus panel negative   Antimicrobials: Anti-infectives (From admission, onward)   Start     Ordered Stop   09/03/19 1000  remdesivir 100 mg in sodium  chloride 0.9 % 100 mL IVPB     Discontinue    "Followed by" Linked Group Details   09/01/19 2335 09/07/19 0959   09/02/19 2200  azithromycin (ZITHROMAX) 500 mg in sodium chloride 0.9 % 250 mL IVPB     Discontinue     09/02/19 0832 09/06/19 2159   09/02/19 2200  cefTRIAXone (ROCEPHIN) 1 g in sodium chloride 0.9 % 100 mL IVPB     Discontinue     09/02/19 0832     09/02/19 1600  valACYclovir (VALTREX) tablet 1,000 mg     Discontinue     09/02/19 1510     09/02/19 0100  remdesivir 200 mg in sodium chloride 0.9% 250 mL IVPB       "Followed by" Linked Group Details   09/01/19 2335 09/02/19 0119   09/01/19 2200  azithromycin (ZITHROMAX) 500 mg in sodium chloride 0.9 % 250 mL IVPB        09/01/19 2158 09/02/19 0033   09/01/19 2200  cefTRIAXone (ROCEPHIN) 1 g in sodium chloride 0.9 % 100 mL IVPB        09/01/19 2158 09/01/19 2306       Devices    LINES / TUBES:      Continuous Infusions:    Objective: Vitals:   09/11/19 0505 09/11/19 0828 09/11/19 0829 09/11/19 1430  BP: (!) 97/55 116/63  113/77  Pulse: 78 87  72  Resp: 19 (!) 21  19  Temp: 97.7 F (36.5 C) 98.8 F (37.1 C)  98.9 F (37.2 C)  TempSrc: Oral Oral  Oral  SpO2: (!) 87% 92% (!) 87% 92%  Weight:      Height:        Intake/Output Summary (Last 24 hours) at 09/11/2019 1446 Last data filed at 09/11/2019 1429 Gross per 24 hour  Intake 593 ml  Output 4800 ml  Net -4207 ml   Filed Weights   09/01/19 1805 09/05/19 1938  Weight: 108.9 kg 108.9 kg   Physical Exam:  Awake Alert, Oriented X 3, No new F.N deficits, Normal affect Symmetrical Chest wall movement, Good air movement bilaterally, CTAB RRR,No Gallops,Rubs or new Murmurs, No Parasternal Heave +ve B.Sounds, Abd Soft, No tenderness, No rebound - guarding or rigidity. No Cyanosis, Clubbing or edema, No new Rash or bruise       CBC: Recent Labs  Lab 09/06/19 0751 09/06/19 0751 09/07/19 0639 09/08/19 0255 09/09/19 0500 09/10/19 0123  09/11/19 0955  WBC 8.2   < > 7.5 7.6 7.9 9.9 10.0  NEUTROABS 7.3  --  6.5 6.4 6.3 8.5*  --   HGB 15.0   < > 14.7 14.4 15.6 15.4 15.9  HCT 48.2   < > 46.9 45.9 49.1 48.8 50.7  MCV 90.6   < > 90.2 90.4 91.9 89.9 90.2  PLT 277   < > 343 446* 485* 561* 538*   < > = values in this  interval not displayed.   Basic Metabolic Panel: Recent Labs  Lab 09/06/19 0751 09/06/19 0751 09/07/19 0639 09/08/19 0255 09/09/19 0500 09/10/19 0123 09/11/19 0955  NA 138   < > 139 137 136 135 135  K 3.9   < > 4.0 4.1 4.7 4.1 4.0  CL 102   < > 101 100 99 97* 94*  CO2 24   < > 24 27 24 27 29   GLUCOSE 81   < > 99 148* 114* 132* 251*  BUN 18   < > 18 17 20 19  22*  CREATININE 0.72   < > 0.69 0.91 0.71 0.76 1.07  CALCIUM 8.5*   < > 8.6* 8.6* 8.8* 9.1 9.0  MG 2.2  --  2.1 2.1 2.0 1.9  --   PHOS 2.9  --  3.2 3.6 4.0 4.2  --    < > = values in this interval not displayed.   GFR: Estimated Creatinine Clearance: 99 mL/min (by C-G formula based on SCr of 1.07 mg/dL). Liver Function Tests: Recent Labs  Lab 09/06/19 0751 09/07/19 0639 09/08/19 0255 09/09/19 0500 09/10/19 0123  AST 53* 40 40 35 34  ALT 46* 46* 49* 48* 53*  ALKPHOS 53 53 53 49 53  BILITOT 0.8 0.9 0.5 0.9 0.6  PROT 6.4* 6.2* 6.4* 6.2* 6.5  ALBUMIN 2.8* 2.5* 2.6* 2.6* 2.7*   No results for input(s): LIPASE, AMYLASE in the last 168 hours. No results for input(s): AMMONIA in the last 168 hours. Coagulation Profile: No results for input(s): INR, PROTIME in the last 168 hours. Cardiac Enzymes: No results for input(s): CKTOTAL, CKMB, CKMBINDEX, TROPONINI in the last 168 hours. BNP (last 3 results) No results for input(s): PROBNP in the last 8760 hours. HbA1C: No results for input(s): HGBA1C in the last 72 hours. CBG: Recent Labs  Lab 09/10/19 1206 09/10/19 1642 09/10/19 2116 09/11/19 0730 09/11/19 1238  GLUCAP 188* 347* 250* 112* 282*   Lipid Profile: No results for input(s): CHOL, HDL, LDLCALC, TRIG, CHOLHDL, LDLDIRECT in the  last 72 hours. Thyroid Function Tests: No results for input(s): TSH, T4TOTAL, FREET4, T3FREE, THYROIDAB in the last 72 hours. Anemia Panel: Recent Labs    09/09/19 0500 09/10/19 0123  FERRITIN 239 277   Sepsis Labs: Recent Labs  Lab 09/04/19 1602 09/04/19 2236 09/05/19 0406 09/06/19 0751  PROCALCITON <0.10  --  <0.10 <0.10  LATICACIDVEN 2.9* 1.9  --   --     Recent Results (from the past 240 hour(s))  Blood culture (routine x 2)     Status: None   Collection Time: 09/01/19  5:56 PM   Specimen: BLOOD  Result Value Ref Range Status   Specimen Description BLOOD RIGHT ANTECUBITAL  Final   Special Requests   Final    BOTTLES DRAWN AEROBIC AND ANAEROBIC Blood Culture adequate volume   Culture   Final    NO GROWTH 5 DAYS Performed at [facility], [address], [place], [address] [place]    Report Status 09/06/2019 FINAL  Final  SARS Coronavirus 2 by RT PCR (hospital order, performed in [facility] hospital lab) Nasopharyngeal Nasopharyngeal Swab     Status: Abnormal   Collection Time: 09/01/19  9:58 PM   Specimen: Nasopharyngeal Swab  Result Value Ref Range Status   SARS Coronavirus 2 POSITIVE (A) NEGATIVE Final    Comment: RESULT CALLED TO, READ BACK BY AND VERIFIED WITH: J [facility] RN 09/01/19 2358 JDW (NOTE) SARS-CoV-2 target nucleic acids are DETECTED  SARS-CoV-2 RNA is generally detectable in upper respiratory specimens  during the acute phase of infection.  Positive results are indicative  of the presence of the identified virus, but do not rule out bacterial infection or co-infection with other pathogens not detected by the test.  Clinical correlation with patient history and  other diagnostic information is necessary to determine patient infection status.  The expected result is negative.  Fact Sheet for Patients:   BoilerBrush.com.cyhttps://www.fda.gov/media/136312/download   Fact Sheet for Healthcare Providers:   https://pope.com/https://www.fda.gov/media/136313/download    This test  is not yet approved or cleared by the [place] FDA and  has been authorized for detection and/or diagnosis of SARS-CoV-2 by FDA under an Emergency Use Authorization (EUA).  This EUA will remain in effect (meaning this test ca n be used) for the duration of  the COVID-19 declaration under Section 564(b)(1) of the Act, 21 U.S.C. section 360-bbb-3(b)(1), unless the authorization is terminated or revoked sooner.  Performed at [facility], [address] [place], [place] [address]   Blood culture (routine x 2)     Status: None   Collection Time: 09/01/19 10:02 PM   Specimen: BLOOD  Result Value Ref Range Status   Specimen Description BLOOD SITE NOT SPECIFIED  Final   Special Requests   Final    BOTTLES DRAWN AEROBIC AND ANAEROBIC Blood Culture adequate volume   Culture   Final    NO GROWTH 5 DAYS Performed at [facility], [address] [place], [place] [address]    Report Status 09/06/2019 FINAL  Final  Respiratory Panel by PCR     Status: None   Collection Time: 09/03/19 11:16 PM   Specimen: Nasopharyngeal Swab; Respiratory  Result Value Ref Range Status   Adenovirus NOT DETECTED NOT DETECTED Final   Coronavirus 229E NOT DETECTED NOT DETECTED Final    Comment: (NOTE) The Coronavirus on the Respiratory Panel, DOES NOT test for the novel  Coronavirus (2019 nCoV)    Coronavirus HKU1 NOT DETECTED NOT DETECTED Final   Coronavirus NL63 NOT DETECTED NOT DETECTED Final   Coronavirus OC43 NOT DETECTED NOT DETECTED Final   Metapneumovirus NOT DETECTED NOT DETECTED Final   Rhinovirus / Enterovirus NOT DETECTED NOT DETECTED Final   Influenza A NOT DETECTED NOT DETECTED Final   Influenza B NOT DETECTED NOT DETECTED Final   Parainfluenza Virus 1 NOT DETECTED NOT DETECTED Final   Parainfluenza Virus 2 NOT DETECTED NOT DETECTED Final   Parainfluenza Virus 3 NOT DETECTED NOT DETECTED Final   Parainfluenza Virus 4 NOT DETECTED NOT DETECTED Final   Respiratory Syncytial  Virus NOT DETECTED NOT DETECTED Final   Bordetella pertussis NOT DETECTED NOT DETECTED Final   Chlamydophila pneumoniae NOT DETECTED NOT DETECTED Final   Mycoplasma pneumoniae NOT DETECTED NOT DETECTED Final    Comment: Performed at [facility], [address] [place], [place] [address]         Radiology Studies: No results found.      Scheduled Meds: . AeroChamber Plus Flo-Vu Large  1 each Other Once  . albuterol  2 puff Inhalation BID  . aspirin EC  81 mg Oral Daily  . dexamethasone (DECADRON) injection  10 mg Intravenous Daily  . enoxaparin (LOVENOX) injection  55 mg Subcutaneous Daily  . feeding supplement (GLUCERNA SHAKE)  237 mL Oral BID BM  . folic acid  1,000 mcg Oral Daily  . gabapentin  300 mg Oral Daily  . gabapentin  600  mg Oral QHS  . insulin aspart  0-15 Units Subcutaneous TID WC  . insulin aspart  0-5 Units Subcutaneous QHS  . insulin aspart  8 Units Subcutaneous TID WC  . insulin detemir  14 Units Subcutaneous BID  . levothyroxine  25 mcg Oral QAC breakfast  . linagliptin  5 mg Oral Daily  . lisinopril  2.5 mg Oral Daily  . pantoprazole  40 mg Oral Daily  . rosuvastatin  10 mg Oral QPM  . sodium chloride flush  3 mL Intravenous Once  . thiamine  100 mg Oral Daily  . valACYclovir  1,000 mg Oral Daily  . zinc sulfate  220 mg Oral Daily   Continuous Infusions:    LOS: 10 days     Huey Bienenstock, MD [facility]  If 7PM-7AM, please contact night-coverage [email or web address] Password [facility] 09/11/2019, 2:46 PM

## 2019-09-11 NOTE — Plan of Care (Signed)
  Problem: Education: Goal: Knowledge of risk factors and measures for prevention of condition will improve Outcome: Progressing   Problem: Coping: Goal: Psychosocial and spiritual needs will be supported Outcome: Progressing   Problem: Activity: Goal: Risk for activity intolerance will decrease Outcome: Progressing   Problem: Nutrition: Goal: Adequate nutrition will be maintained Outcome: Progressing

## 2019-09-11 NOTE — Plan of Care (Signed)
  Problem: Education: Goal: Knowledge of risk factors and measures for prevention of condition will improve 09/11/2019 0510 by Lawrence Marseilles, RN Outcome: Progressing 09/11/2019 0407 by Lawrence Marseilles, RN Outcome: Progressing   Problem: Coping: Goal: Psychosocial and spiritual needs will be supported Outcome: Progressing   Problem: Respiratory: Goal: Will maintain a patent airway Outcome: Progressing   Problem: Activity: Goal: Risk for activity intolerance will decrease Outcome: Progressing   Problem: Nutrition: Goal: Adequate nutrition will be maintained Outcome: Progressing

## 2019-09-12 LAB — CBC
HCT: 49.5 % (ref 39.0–52.0)
Hemoglobin: 16.1 g/dL (ref 13.0–17.0)
MCH: 29.6 pg (ref 26.0–34.0)
MCHC: 32.5 g/dL (ref 30.0–36.0)
MCV: 91 fL (ref 80.0–100.0)
Platelets: 568 10*3/uL — ABNORMAL HIGH (ref 150–400)
RBC: 5.44 MIL/uL (ref 4.22–5.81)
RDW: 13.8 % (ref 11.5–15.5)
WBC: 12.6 10*3/uL — ABNORMAL HIGH (ref 4.0–10.5)
nRBC: 0 % (ref 0.0–0.2)

## 2019-09-12 LAB — GLUCOSE, CAPILLARY
Glucose-Capillary: 132 mg/dL — ABNORMAL HIGH (ref 70–99)
Glucose-Capillary: 227 mg/dL — ABNORMAL HIGH (ref 70–99)
Glucose-Capillary: 309 mg/dL — ABNORMAL HIGH (ref 70–99)
Glucose-Capillary: 313 mg/dL — ABNORMAL HIGH (ref 70–99)

## 2019-09-12 LAB — BRAIN NATRIURETIC PEPTIDE: B Natriuretic Peptide: 27.7 pg/mL (ref 0.0–100.0)

## 2019-09-12 LAB — BASIC METABOLIC PANEL
Anion gap: 14 (ref 5–15)
BUN: 22 mg/dL — ABNORMAL HIGH (ref 6–20)
CO2: 26 mmol/L (ref 22–32)
Calcium: 8.8 mg/dL — ABNORMAL LOW (ref 8.9–10.3)
Chloride: 95 mmol/L — ABNORMAL LOW (ref 98–111)
Creatinine, Ser: 0.86 mg/dL (ref 0.61–1.24)
GFR calc Af Amer: >60 mL/min (ref >60)
GFR calc non Af Amer: >60 mL/min (ref >60)
Glucose, Bld: 125 mg/dL — ABNORMAL HIGH (ref 70–99)
Potassium: 5 mmol/L (ref 3.5–5.1)
Sodium: 135 mmol/L (ref 135–145)

## 2019-09-12 LAB — D-DIMER, QUANTITATIVE: D-Dimer, Quant: 1.45 ug/mL-FEU — ABNORMAL HIGH (ref 0.00–0.50)

## 2019-09-12 MED ORDER — FUROSEMIDE 10 MG/ML IJ SOLN
40.0000 mg | Freq: Once | INTRAMUSCULAR | Status: AC
  Administered 2019-09-12: 40 mg via INTRAVENOUS
  Filled 2019-09-12: qty 4

## 2019-09-12 MED ORDER — SODIUM ZIRCONIUM CYCLOSILICATE 10 G PO PACK
10.0000 g | PACK | Freq: Once | ORAL | Status: AC
  Administered 2019-09-12: 10 g via ORAL
  Filled 2019-09-12: qty 1

## 2019-09-12 NOTE — Progress Notes (Signed)
PROGRESS NOTE    Thomas Silva  IRC:[id number] DOB: May 27, 1965 DOA: 09/01/2019 PCP: Casper HarrisonStreet, Stephanie Couphristopher M, MD     Brief Narrative:   Thomas Silva is a 54 y.o. WM PMHx HLD.  Pt with several day h/o SOB and cough.  Presented to PCP's office today.  Tested COVID positive.  Pt has not had vaccine.   ED Course: Tm 102.7, CXR with bilateral infiltrates C/W COVID.  Desats to 87% on RA, currently satting well but on 4L via [place].   Subjective:  Patient remains on 4 L nasal cannula at rest, he is requiring up to 6 L nasal cannula with activity, reports cough still present, dyspnea has significantly improved.  .  Assessment & Plan: Covid vaccination; no vaccination   Active Problems:   Acute hypoxemic respiratory failure due to COVID-19 [facility](HCC)   Pneumonia due to COVID-19 virus   Community acquired pneumonia   HLD (hyperlipidemia)   Hypothyroidism   AKI (acute kidney injury) (HCC)  Acute respiratory failure with hypoxia/Covid 19 pneumonia -Patient imaging significant for multifocal opacity, with significant oxygen requirement at one point requiring 13 L on high flow nasal cannula, this has been improving, he has improved oxygen requirement, currently on 4 L at rest, up to 6 L with activity . -He was encouraged to use incentive spirometry, flutter valve, he was encouraged to get out of bed to chair, he will ambulate with staff today. -Treated with IV remdesivir. -Treated with IV steroids, today's dose 10/10. -Patient with history of hepatitis B, so he did not receive Actemra. -8/5 transfused 1 unit Covid Convalescent Plasma -8/8 patient's D-dimer trending up again will obtain CTA PE protocol and bilateral lower extremity Doppler to rule out PE/DVT -8/9 CTA PE protocol negative for PE see results below -8/10 bilateral lower extremity Doppler negative see results below -Patient has been diuresed as needed, will repeat his IV Lasix today as well, will give 40 mg IV once.  Potassium is five  today, will go ahead and give one dose of Lokelma as well.  COVID-19 Labs  Recent Labs    09/10/19 0123 09/11/19 0955 09/12/19 0445  DDIMER 1.71* 1.73* 1.45*  FERRITIN 277  --   --   LDH 286*  --   --   CRP 1.2*  --   --     Lab Results  Component Value Date   SARSCOV2NAA POSITIVE (A) 09/01/2019   CAP -He was treated with IV Rocephin and azithromycin  Hyperlipidemia --Crestor 10 mg daily  Hypothyroidism -Synthroid 25 mcg daily  DM type II uncontrolled with complication -8/4 hemoglobin A1c= 8.5 -Currently acceptable on current dose of Levemir, premeal NovoLog and sliding scale.   AKI (baseline Cr 1.0) Recent Labs  Lab 09/08/19 0255 09/09/19 0500 09/10/19 0123 09/11/19 0955 09/12/19 0445  CREATININE 0.91 0.71 0.76 1.07 0.86  -At baseline   GERD -Continue with PPI    DVT prophylaxis: Lovenox Code Status: Full Family Communication: I have updated patient,  Status is: Inpatient    Dispo: The patient is from: Home              Anticipated d/c is to: Home              Anticipated d/c date is: 8/15              Patient currently unstable      Consultants:    Procedures/Significant Events negative DVT 8/2 PCXR;Bilateral streaky densities most consistent with COVID-19 Infiltrate. 8/8 CTA chest PE  protocol;-negative PE 2. Very extensive bilateral, somewhat geographic heterogeneous and ground-glass airspace opacity throughout the lungs, consistent with COVID-19 airspace disease. 3. Coronary artery disease. 8/9 lower extremity Doppler; negative DVT   I have personally reviewed and interpreted all radiology studies and my findings are as above.  VENTILATOR SETTINGS: HFNC 8/10  flow; 5 L/min SPO2; 91%    Cultures 8/2 SARS coronavirus positive 8/2 HIV screen negative 8/2 blood RIGHT AC NGTD 8/2 blood NGTD 8/4 virus panel negative   Antimicrobials: Anti-infectives (From admission, onward)   Start     Ordered Stop   09/03/19 1000   remdesivir 100 mg in sodium chloride 0.9 % 100 mL IVPB     Discontinue    "Followed by" Linked Group Details   09/01/19 2335 09/07/19 0959   09/02/19 2200  azithromycin (ZITHROMAX) 500 mg in sodium chloride 0.9 % 250 mL IVPB     Discontinue     09/02/19 0832 09/06/19 2159   09/02/19 2200  cefTRIAXone (ROCEPHIN) 1 g in sodium chloride 0.9 % 100 mL IVPB     Discontinue     09/02/19 0832     09/02/19 1600  valACYclovir (VALTREX) tablet 1,000 mg     Discontinue     09/02/19 1510     09/02/19 0100  remdesivir 200 mg in sodium chloride 0.9% 250 mL IVPB       "Followed by" Linked Group Details   09/01/19 2335 09/02/19 0119   09/01/19 2200  azithromycin (ZITHROMAX) 500 mg in sodium chloride 0.9 % 250 mL IVPB        09/01/19 2158 09/02/19 0033   09/01/19 2200  cefTRIAXone (ROCEPHIN) 1 g in sodium chloride 0.9 % 100 mL IVPB        09/01/19 2158 09/01/19 2306        Continuous Infusions:    Objective: Vitals:   09/11/19 1430 09/11/19 2038 09/11/19 2058 09/12/19 0511  BP: 113/77  124/68 101/70  Pulse: 72 85 80 66  Resp: 19 19 20 19   Temp: 98.9 F (37.2 C)  97.9 F (36.6 C) 98.3 F (36.8 C)  TempSrc: Oral  Oral Oral  SpO2: 92% 92% 92% 91%  Weight:      Height:        Intake/Output Summary (Last 24 hours) at 09/12/2019 1250 Last data filed at 09/12/2019 1227 Gross per 24 hour  Intake 660 ml  Output 3150 ml  Net -2490 ml   Filed Weights   09/01/19 1805 09/05/19 1938  Weight: 108.9 kg 108.9 kg   Physical Exam:  Awake Alert, Oriented X 3, No new F.N deficits, Normal affect Symmetrical Chest wall movement, Good air movement bilaterally, CTAB RRR,No Gallops,Rubs or new Murmurs, No Parasternal Heave +ve B.Sounds, Abd Soft, No tenderness, No rebound - guarding or rigidity. No Cyanosis, Clubbing or edema, No new Rash or bruise     CBC: Recent Labs  Lab 09/06/19 0751 09/06/19 0751 09/07/19 0639 09/07/19 0639 09/08/19 0255 09/09/19 0500 09/10/19 0123 09/11/19 0955  09/12/19 0445  WBC 8.2   < > 7.5   < > 7.6 7.9 9.9 10.0 12.6*  NEUTROABS 7.3  --  6.5  --  6.4 6.3 8.5*  --   --   HGB 15.0   < > 14.7   < > 14.4 15.6 15.4 15.9 16.1  HCT 48.2   < > 46.9   < > 45.9 49.1 48.8 50.7 49.5  MCV 90.6   < > 90.2   < >  90.4 91.9 89.9 90.2 91.0  PLT 277   < > 343   < > 446* 485* 561* 538* 568*   < > = values in this interval not displayed.   Basic Metabolic Panel: Recent Labs  Lab 09/06/19 0751 09/06/19 0751 09/07/19 0639 09/07/19 0639 09/08/19 0255 09/09/19 0500 09/10/19 0123 09/11/19 0955 09/12/19 0445  NA 138   < > 139   < > 137 136 135 135 135  K 3.9   < > 4.0   < > 4.1 4.7 4.1 4.0 5.0  CL 102   < > 101   < > 100 99 97* 94* 95*  CO2 24   < > 24   < > 27 24 27 29 26   GLUCOSE 81   < > 99   < > 148* 114* 132* 251* 125*  BUN 18   < > 18   < > 17 20 19  22* 22*  CREATININE 0.72   < > 0.69   < > 0.91 0.71 0.76 1.07 0.86  CALCIUM 8.5*   < > 8.6*   < > 8.6* 8.8* 9.1 9.0 8.8*  MG 2.2  --  2.1  --  2.1 2.0 1.9  --   --   PHOS 2.9  --  3.2  --  3.6 4.0 4.2  --   --    < > = values in this interval not displayed.   GFR: Estimated Creatinine Clearance: 123.2 mL/min (by C-G formula based on SCr of 0.86 mg/dL). Liver Function Tests: Recent Labs  Lab 09/06/19 0751 09/07/19 0639 09/08/19 0255 09/09/19 0500 09/10/19 0123  AST 53* 40 40 35 34  ALT 46* 46* 49* 48* 53*  ALKPHOS 53 53 53 49 53  BILITOT 0.8 0.9 0.5 0.9 0.6  PROT 6.4* 6.2* 6.4* 6.2* 6.5  ALBUMIN 2.8* 2.5* 2.6* 2.6* 2.7*   No results for input(s): LIPASE, AMYLASE in the last 168 hours. No results for input(s): AMMONIA in the last 168 hours. Coagulation Profile: No results for input(s): INR, PROTIME in the last 168 hours. Cardiac Enzymes: No results for input(s): CKTOTAL, CKMB, CKMBINDEX, TROPONINI in the last 168 hours. BNP (last 3 results) No results for input(s): PROBNP in the last 8760 hours. HbA1C: No results for input(s): HGBA1C in the last 72 hours. CBG: Recent Labs  Lab  09/11/19 1238 09/11/19 1633 09/11/19 2102 09/12/19 0735 09/12/19 1151  GLUCAP 282* 308* 266* 132* 227*   Lipid Profile: No results for input(s): CHOL, HDL, LDLCALC, TRIG, CHOLHDL, LDLDIRECT in the last 72 hours. Thyroid Function Tests: No results for input(s): TSH, T4TOTAL, FREET4, T3FREE, THYROIDAB in the last 72 hours. Anemia Panel: Recent Labs    09/10/19 0123  FERRITIN 277   Sepsis Labs: Recent Labs  Lab 09/06/19 0751  PROCALCITON <0.10    Recent Results (from the past 240 hour(s))  Respiratory Panel by PCR     Status: None   Collection Time: 09/03/19 11:16 PM   Specimen: Nasopharyngeal Swab; Respiratory  Result Value Ref Range Status   Adenovirus NOT DETECTED NOT DETECTED Final   Coronavirus 229E NOT DETECTED NOT DETECTED Final    Comment: (NOTE) The Coronavirus on the Respiratory Panel, DOES NOT test for the novel  Coronavirus (2019 nCoV)    Coronavirus HKU1 NOT DETECTED NOT DETECTED Final   Coronavirus NL63 NOT DETECTED NOT DETECTED Final   Coronavirus OC43 NOT DETECTED NOT DETECTED Final   Metapneumovirus NOT DETECTED NOT DETECTED Final   Rhinovirus / Enterovirus  NOT DETECTED NOT DETECTED Final   Influenza A NOT DETECTED NOT DETECTED Final   Influenza B NOT DETECTED NOT DETECTED Final   Parainfluenza Virus 1 NOT DETECTED NOT DETECTED Final   Parainfluenza Virus 2 NOT DETECTED NOT DETECTED Final   Parainfluenza Virus 3 NOT DETECTED NOT DETECTED Final   Parainfluenza Virus 4 NOT DETECTED NOT DETECTED Final   Respiratory Syncytial Virus NOT DETECTED NOT DETECTED Final   Bordetella pertussis NOT DETECTED NOT DETECTED Final   Chlamydophila pneumoniae NOT DETECTED NOT DETECTED Final   Mycoplasma pneumoniae NOT DETECTED NOT DETECTED Final    Comment: Performed at [facility], [address], [place], [place] [address]         Radiology Studies: No results found.      Scheduled Meds: . AeroChamber Plus Flo-Vu Large  1 each Other Once  .  albuterol  2 puff Inhalation BID  . aspirin EC  81 mg Oral Daily  . enoxaparin (LOVENOX) injection  55 mg Subcutaneous Daily  . feeding supplement (GLUCERNA SHAKE)  237 mL Oral BID BM  . folic acid  1,000 mcg Oral Daily  . gabapentin  300 mg Oral Daily  . gabapentin  600 mg Oral QHS  . insulin aspart  0-15 Units Subcutaneous TID WC  . insulin aspart  0-5 Units Subcutaneous QHS  . insulin aspart  10 Units Subcutaneous TID WC  . insulin detemir  14 Units Subcutaneous BID  . levothyroxine  25 mcg Oral QAC breakfast  . linagliptin  5 mg Oral Daily  . lisinopril  2.5 mg Oral Daily  . pantoprazole  40 mg Oral Daily  . rosuvastatin  10 mg Oral QPM  . sodium chloride flush  3 mL Intravenous Once  . thiamine  100 mg Oral Daily  . valACYclovir  1,000 mg Oral Daily  . zinc sulfate  220 mg Oral Daily   Continuous Infusions:    LOS: 11 days     Huey Bienenstock, MD [facility]  If 7PM-7AM, please contact night-coverage [email or web address] Password [facility] 09/12/2019, 12:50 PM

## 2019-09-12 NOTE — Progress Notes (Signed)
Physical Therapy Treatment Patient Details Name: Thomas Silva MRN: [id number] DOB: 1965/07/02 Today's Date: 09/12/2019    History of Present Illness Pt is a 54 y.o. male with PMH of HLD, DM, hep B, admitted 09/01/19 with several days of SOB and cough. Covid +/no vaccine. CXR with bilateral infiltrates. Workup for hypoxic respiratory failure, COVID PNA, AKI.   PT Comments    Pt progressing well with mobility. Independent with ADL tasks and hallway ambulation, only requiring assist to manage lines and monitor O2 needs. Pt maintaining SpO2 87-94% on 6L O2 with activity; >/90% on 4L O2 at rest. Pt has met short-term acute PT goals. Reviewed education, including activity recommendations and importance of continued mobility, including hallway ambulation, during hospital admission. Pt has no further questions or concerns. Will d/c acute PT.    Follow Up Recommendations  No PT follow up     Equipment Recommendations  None recommended by PT    Recommendations for Other Services       Precautions / Restrictions Precautions Precautions: Other (comment) Precaution Comments: Watch SpO2 Restrictions Weight Bearing Restrictions: No    Mobility  Bed Mobility               General bed mobility comments: Received sitting in recliner  Transfers Overall transfer level: Independent Equipment used: None             General transfer comment: Independent standing from recliner and low toilet height  Ambulation/Gait Ambulation/Gait assistance: Modified independent (Device/Increase time) Gait Distance (Feet): 320 Feet Assistive device: None Gait Pattern/deviations: Step-through pattern;Decreased stride length   Gait velocity interpretation: 1.31 - 2.62 ft/sec, indicative of limited community ambulator General Gait Details: Steady gait while pushing O2 tank, only requiring assist to manage lines. SpO2 87-94% on 6L O2    Stairs             Wheelchair Mobility    Modified  Rankin (Stroke Patients Only)       Balance Overall balance assessment: No apparent balance deficits (not formally assessed)   Sitting balance-Leahy Scale: Good       Standing balance-Leahy Scale: Good                              Cognition Arousal/Alertness: Awake/alert Behavior During Therapy: WFL for tasks assessed/performed Overall Cognitive Status: Within Functional Limits for tasks assessed                                        Exercises      General Comments General comments (skin integrity, edema, etc.): Reviewed educ re: activity recommendations (including multiple bouts of standing activity throughout day while admitted), energy conservation (especially wtih ADL tasks), flutter valve and incentive spirometer use (able to pull ~1500 mL), safety precautions for return home, continued hallway ambulation with staff assist for lines      Pertinent Vitals/Pain Pain Assessment: No/denies pain    Home Living                      Prior Function            PT Goals (current goals can now be found in the care plan section) Progress towards PT goals: Goals met/education completed, patient discharged from PT    Frequency           PT  Plan Current plan remains appropriate    Co-evaluation              AM-PAC PT "6 Clicks" Mobility   Outcome Measure  Help needed turning from your back to your side while in a flat bed without using bedrails?: None Help needed moving from lying on your back to sitting on the side of a flat bed without using bedrails?: None Help needed moving to and from a bed to a chair (including a wheelchair)?: None Help needed standing up from a chair using your arms (e.g., wheelchair or bedside chair)?: None Help needed to walk in hospital room?: None Help needed climbing 3-5 steps with a railing? : None 6 Click Score: 24    End of Session Equipment Utilized During Treatment: Oxygen Activity  Tolerance: Patient tolerated treatment well Patient left: in chair;with call bell/phone within reach Nurse Communication: Mobility status PT Visit Diagnosis: Other abnormalities of gait and mobility (R26.89)     Time: 1308-6578 PT Time Calculation (min) (ACUTE ONLY): 21 min  Charges:  $Therapeutic Exercise: 8-22 mins                     Mabeline Caras, PT, DPT Acute Rehabilitation Services  Pager [phone] Office [facility] 09/12/2019, 5:06 PM

## 2019-09-13 LAB — BASIC METABOLIC PANEL
Anion gap: 11 (ref 5–15)
BUN: 18 mg/dL (ref 6–20)
CO2: 33 mmol/L — ABNORMAL HIGH (ref 22–32)
Calcium: 8.9 mg/dL (ref 8.9–10.3)
Chloride: 88 mmol/L — ABNORMAL LOW (ref 98–111)
Creatinine, Ser: 1.04 mg/dL (ref 0.61–1.24)
GFR calc Af Amer: >60 mL/min (ref >60)
GFR calc non Af Amer: >60 mL/min (ref >60)
Glucose, Bld: 345 mg/dL — ABNORMAL HIGH (ref 70–99)
Potassium: 3.9 mmol/L (ref 3.5–5.1)
Sodium: 132 mmol/L — ABNORMAL LOW (ref 135–145)

## 2019-09-13 LAB — CBC
HCT: 46.8 % (ref 39.0–52.0)
Hemoglobin: 14.6 g/dL (ref 13.0–17.0)
MCH: 28.5 pg (ref 26.0–34.0)
MCHC: 31.2 g/dL (ref 30.0–36.0)
MCV: 91.4 fL (ref 80.0–100.0)
Platelets: 504 10*3/uL — ABNORMAL HIGH (ref 150–400)
RBC: 5.12 MIL/uL (ref 4.22–5.81)
RDW: 13.7 % (ref 11.5–15.5)
WBC: 13.2 10*3/uL — ABNORMAL HIGH (ref 4.0–10.5)
nRBC: 0 % (ref 0.0–0.2)

## 2019-09-13 LAB — GLUCOSE, CAPILLARY
Glucose-Capillary: 206 mg/dL — ABNORMAL HIGH (ref 70–99)
Glucose-Capillary: 219 mg/dL — ABNORMAL HIGH (ref 70–99)
Glucose-Capillary: 253 mg/dL — ABNORMAL HIGH (ref 70–99)
Glucose-Capillary: 330 mg/dL — ABNORMAL HIGH (ref 70–99)

## 2019-09-13 LAB — POTASSIUM: Potassium: 4.1 mmol/L (ref 3.5–5.1)

## 2019-09-13 LAB — D-DIMER, QUANTITATIVE: D-Dimer, Quant: 1.33 ug/mL-FEU — ABNORMAL HIGH (ref 0.00–0.50)

## 2019-09-13 LAB — MAGNESIUM: Magnesium: 1.9 mg/dL (ref 1.7–2.4)

## 2019-09-13 MED ORDER — METOPROLOL TARTRATE 12.5 MG HALF TABLET
12.5000 mg | ORAL_TABLET | Freq: Two times a day (BID) | ORAL | Status: DC
  Administered 2019-09-13 – 2019-09-16 (×6): 12.5 mg via ORAL
  Filled 2019-09-13 (×6): qty 1

## 2019-09-13 NOTE — TOC Transition Note (Signed)
Transition of Care [facility]) - CM/SW Discharge Note   Patient Details  Name: Thomas Silva MRN: [id number] Date of Birth: 1965/07/21  [facility] CM/SW Contact:  Lawerance Sabal, RN Phone Number: 09/13/2019, 5:31 PM   Clinical Narrative:    [name] w patient on the phone to discuss home oxygen. [employer] will deliver tanks for transport home to room tomorrow morning.  Concentrator for home use will be delivered to his house after he gets home tomorrow.     Final next level of care: Home/Self Care     Patient Goals and CMS Choice        Discharge Placement                       Discharge Plan and Services                DME Arranged: Oxygen DME Agency:  [facility]) Date DME Agency Contacted: 09/13/19 Time DME Agency Contacted: [phone] Representative spoke with at DME Agency: jermaine            Social Determinants of Health (SDOH) Interventions     Readmission Risk Interventions No flowsheet data found.

## 2019-09-13 NOTE — Progress Notes (Signed)
PROGRESS NOTE    Thomas Silva  INO:[id number] DOB: 1965/07/15 DOA: 09/01/2019 PCP: [name], [name], MD     Brief Narrative:   Thomas Silva is a 54 y.o. WM PMHx HLD.  Pt with several day h/o SOB and cough.  Presented to PCP's office today.  Tested COVID positive.  Pt has not had vaccine.   ED Course: Tm 102.7, CXR with bilateral infiltrates C/W COVID.  Desats to 87% on RA, currently satting well but on 4L via [place].   Subjective:  He denies any complaints, still requiring 6 to 8 L nasal cannula with activity, reports cough significantly improved .  Assessment & Plan: Covid vaccination; no vaccination   Active Problems:   Acute hypoxemic respiratory failure due to COVID-19 [facility])   Pneumonia due to COVID-19 virus   Community acquired pneumonia   HLD (hyperlipidemia)   Hypothyroidism   AKI (acute kidney injury) (HCC)  Acute respiratory failure with hypoxia/Covid 19 pneumonia -Patient imaging significant for multifocal opacity, with significant oxygen requirement at one point requiring 13 L on high flow nasal cannula, this has been improving, he has improved oxygen requirement, he is still requiring 2 to 4 L nasal cannula at rest, 6 to 8 L with activity, likely he will need home oxygen on discharge. -He was encouraged to use incentive spirometry, flutter valve, he was encouraged to get out of bed to chair, he will ambulate with staff today. -Treated with IV remdesivir. -Treated with IV steroids, initial total of 10 days. -Patient with history of hepatitis B, so he did not receive Actemra. -8/5 transfused 1 unit Covid Convalescent Plasma -8/8 patient's D-dimer trending up again will obtain CTA PE protocol and bilateral lower extremity Doppler to rule out PE/DVT -8/9 CTA PE protocol negative for PE see results below -8/10 bilateral lower extremity Doppler negative see results below -He is with some lower extremity edema, so we will go ahead and give Lasix today  COVID-19  Labs  Recent Labs    09/11/19 0955 09/12/19 0445 09/13/19 0907  DDIMER 1.73* 1.45* 1.33*    Lab Results  Component Value Date   SARSCOV2NAA POSITIVE (A) 09/01/2019   CAP -He was treated with IV Rocephin and azithromycin  Hyperlipidemia --Crestor 10 mg daily  Hypothyroidism -Synthroid 25 mcg daily  DM type II uncontrolled with complication -8/4 hemoglobin A1c= 8.5 -Currently acceptable on current dose of Levemir, premeal NovoLog and sliding scale.   AKI (baseline Cr 1.0) Recent Labs  Lab 09/09/19 0500 09/10/19 0123 09/11/19 0955 09/12/19 0445 09/13/19 0907  CREATININE 0.71 0.76 1.07 0.86 1.04  -At baseline   GERD -Continue with PPI    DVT prophylaxis: Lovenox Code Status: Full Family Communication: I have updated patient,  Status is: Inpatient    Dispo: The patient is from: Home              Anticipated d/c is to: Home              Anticipated d/c date is: 8/15              Patient currently unstable      Consultants:    Procedures/Significant Events negative DVT 8/2 PCXR;Bilateral streaky densities most consistent with COVID-19 Infiltrate. 8/8 CTA chest PE protocol;-negative PE 2. Very extensive bilateral, somewhat geographic heterogeneous and ground-glass airspace opacity throughout the lungs, consistent with COVID-19 airspace disease. 3. Coronary artery disease. 8/9 lower extremity Doppler; negative DVT   I have personally reviewed and interpreted all radiology studies and  my findings are as above.  VENTILATOR SETTINGS: HFNC 8/10  flow; 5 L/min SPO2; 91%    Cultures 8/2 SARS coronavirus positive 8/2 HIV screen negative 8/2 blood RIGHT AC NGTD 8/2 blood NGTD 8/4 virus panel negative   Antimicrobials: Anti-infectives (From admission, onward)   Start     Ordered Stop   09/03/19 1000  remdesivir 100 mg in sodium chloride 0.9 % 100 mL IVPB     Discontinue    "Followed by" Linked Group Details   09/01/19 2335 09/07/19  0959   09/02/19 2200  azithromycin (ZITHROMAX) 500 mg in sodium chloride 0.9 % 250 mL IVPB     Discontinue     09/02/19 0832 09/06/19 2159   09/02/19 2200  cefTRIAXone (ROCEPHIN) 1 g in sodium chloride 0.9 % 100 mL IVPB     Discontinue     09/02/19 0832     09/02/19 1600  valACYclovir (VALTREX) tablet 1,000 mg     Discontinue     09/02/19 1510     09/02/19 0100  remdesivir 200 mg in sodium chloride 0.9% 250 mL IVPB       "Followed by" Linked Group Details   09/01/19 2335 09/02/19 0119   09/01/19 2200  azithromycin (ZITHROMAX) 500 mg in sodium chloride 0.9 % 250 mL IVPB        09/01/19 2158 09/02/19 0033   09/01/19 2200  cefTRIAXone (ROCEPHIN) 1 g in sodium chloride 0.9 % 100 mL IVPB        09/01/19 2158 09/01/19 2306        Continuous Infusions:    Objective: Vitals:   09/12/19 0511 09/12/19 1435 09/12/19 2112 09/13/19 0459  BP: 101/70 114/69 116/64 125/74  Pulse: 66 73 73 69  Resp: 19 19 16 17   Temp: 98.3 F (36.8 C) 98.5 F (36.9 C) 98 F (36.7 C) 97.8 F (36.6 C)  TempSrc: Oral Oral Oral Oral  SpO2: 91% 96% 93% 92%  Weight:      Height:        Intake/Output Summary (Last 24 hours) at 09/13/2019 1257 Last data filed at 09/13/2019 0932 Gross per 24 hour  Intake 920 ml  Output 2800 ml  Net -1880 ml   Filed Weights   09/01/19 1805 09/05/19 1938  Weight: 108.9 kg 108.9 kg   Physical Exam:  Awake Alert, Oriented X 3, No new F.N deficits, Normal affect Symmetrical Chest wall movement, Good air movement bilaterally, CTAB RRR,No Gallops,Rubs or new Murmurs, No Parasternal Heave +ve B.Sounds, Abd Soft, No tenderness, No rebound - guarding or rigidity. No Cyanosis, Clubbing or edema, No new Rash or bruise     CBC: Recent Labs  Lab 09/07/19 0639 09/07/19 0639 09/08/19 0255 09/08/19 0255 09/09/19 0500 09/10/19 0123 09/11/19 0955 09/12/19 0445 09/13/19 0907  WBC 7.5   < > 7.6   < > 7.9 9.9 10.0 12.6* 13.2*  NEUTROABS 6.5  --  6.4  --  6.3 8.5*  --   --   --    HGB 14.7   < > 14.4   < > 15.6 15.4 15.9 16.1 14.6  HCT 46.9   < > 45.9   < > 49.1 48.8 50.7 49.5 46.8  MCV 90.2   < > 90.4   < > 91.9 89.9 90.2 91.0 91.4  PLT 343   < > 446*   < > 485* 561* 538* 568* 504*   < > = values in this interval not displayed.   Basic Metabolic  Panel: Recent Labs  Lab 09/07/19 0639 09/07/19 5400 09/08/19 0255 09/08/19 0255 09/09/19 0500 09/10/19 0123 09/11/19 0955 09/12/19 0445 09/13/19 0907  NA 139   < > 137   < > 136 135 135 135 132*  K 4.0   < > 4.1   < > 4.7 4.1 4.0 5.0 3.9  CL 101   < > 100   < > 99 97* 94* 95* 88*  CO2 24   < > 27   < > 24 27 29 26  33*  GLUCOSE 99   < > 148*   < > 114* 132* 251* 125* 345*  BUN 18   < > 17   < > 20 19 22* 22* 18  CREATININE 0.69   < > 0.91   < > 0.71 0.76 1.07 0.86 1.04  CALCIUM 8.6*   < > 8.6*   < > 8.8* 9.1 9.0 8.8* 8.9  MG 2.1  --  2.1  --  2.0 1.9  --   --   --   PHOS 3.2  --  3.6  --  4.0 4.2  --   --   --    < > = values in this interval not displayed.   GFR: Estimated Creatinine Clearance: 101.9 mL/min (by C-G formula based on SCr of 1.04 mg/dL). Liver Function Tests: Recent Labs  Lab 09/07/19 0639 09/08/19 0255 09/09/19 0500 09/10/19 0123  AST 40 40 35 34  ALT 46* 49* 48* 53*  ALKPHOS 53 53 49 53  BILITOT 0.9 0.5 0.9 0.6  PROT 6.2* 6.4* 6.2* 6.5  ALBUMIN 2.5* 2.6* 2.6* 2.7*   No results for input(s): LIPASE, AMYLASE in the last 168 hours. No results for input(s): AMMONIA in the last 168 hours. Coagulation Profile: No results for input(s): INR, PROTIME in the last 168 hours. Cardiac Enzymes: No results for input(s): CKTOTAL, CKMB, CKMBINDEX, TROPONINI in the last 168 hours. BNP (last 3 results) No results for input(s): PROBNP in the last 8760 hours. HbA1C: No results for input(s): HGBA1C in the last 72 hours. CBG: Recent Labs  Lab 09/12/19 1151 09/12/19 1722 09/12/19 2115 09/13/19 0739 09/13/19 1159  GLUCAP 227* 309* 313* 206* 219*   Lipid Profile: No results for input(s):  CHOL, HDL, LDLCALC, TRIG, CHOLHDL, LDLDIRECT in the last 72 hours. Thyroid Function Tests: No results for input(s): TSH, T4TOTAL, FREET4, T3FREE, THYROIDAB in the last 72 hours. Anemia Panel: No results for input(s): VITAMINB12, FOLATE, FERRITIN, TIBC, IRON, RETICCTPCT in the last 72 hours. Sepsis Labs: No results for input(s): PROCALCITON, LATICACIDVEN in the last 168 hours.  Recent Results (from the past 240 hour(s))  Respiratory Panel by PCR     Status: None   Collection Time: 09/03/19 11:16 PM   Specimen: Nasopharyngeal Swab; Respiratory  Result Value Ref Range Status   Adenovirus NOT DETECTED NOT DETECTED Final   Coronavirus 229E NOT DETECTED NOT DETECTED Final    Comment: (NOTE) The Coronavirus on the Respiratory Panel, DOES NOT test for the novel  Coronavirus (2019 nCoV)    Coronavirus HKU1 NOT DETECTED NOT DETECTED Final   Coronavirus NL63 NOT DETECTED NOT DETECTED Final   Coronavirus OC43 NOT DETECTED NOT DETECTED Final   Metapneumovirus NOT DETECTED NOT DETECTED Final   Rhinovirus / Enterovirus NOT DETECTED NOT DETECTED Final   Influenza A NOT DETECTED NOT DETECTED Final   Influenza B NOT DETECTED NOT DETECTED Final   Parainfluenza Virus 1 NOT DETECTED NOT DETECTED Final   Parainfluenza Virus 2  NOT DETECTED NOT DETECTED Final   Parainfluenza Virus 3 NOT DETECTED NOT DETECTED Final   Parainfluenza Virus 4 NOT DETECTED NOT DETECTED Final   Respiratory Syncytial Virus NOT DETECTED NOT DETECTED Final   Bordetella pertussis NOT DETECTED NOT DETECTED Final   Chlamydophila pneumoniae NOT DETECTED NOT DETECTED Final   Mycoplasma pneumoniae NOT DETECTED NOT DETECTED Final    Comment: Performed at [facility], [address], [place], [place] [address]         Radiology Studies: No results found.      Scheduled Meds: . AeroChamber Plus Flo-Vu Large  1 each Other Once  . albuterol  2 puff Inhalation BID  . aspirin EC  81 mg Oral Daily  . enoxaparin (LOVENOX)  injection  55 mg Subcutaneous Daily  . feeding supplement (GLUCERNA SHAKE)  237 mL Oral BID BM  . folic acid  1,000 mcg Oral Daily  . gabapentin  300 mg Oral Daily  . gabapentin  600 mg Oral QHS  . insulin aspart  0-15 Units Subcutaneous TID WC  . insulin aspart  0-5 Units Subcutaneous QHS  . insulin aspart  10 Units Subcutaneous TID WC  . insulin detemir  14 Units Subcutaneous BID  . levothyroxine  25 mcg Oral QAC breakfast  . linagliptin  5 mg Oral Daily  . lisinopril  2.5 mg Oral Daily  . pantoprazole  40 mg Oral Daily  . rosuvastatin  10 mg Oral QPM  . sodium chloride flush  3 mL Intravenous Once  . thiamine  100 mg Oral Daily  . valACYclovir  1,000 mg Oral Daily  . zinc sulfate  220 mg Oral Daily   Continuous Infusions:    LOS: 12 days     Huey Bienenstock, MD [facility]  If 7PM-7AM, please contact night-coverage [email or web address] Password [facility] 09/13/2019, 12:57 PM

## 2019-09-13 NOTE — Progress Notes (Signed)
SATURATION QUALIFICATIONS: (This note is used to comply with regulatory documentation for home oxygen)  Patient Saturations on Room Air at Rest = 83%  Patient Saturations on Room Air while Ambulating = 77%  Patient Saturations on 6 Liters of oxygen while Ambulating = 94%  Please briefly explain why patient needs home oxygen:

## 2019-09-13 NOTE — Progress Notes (Signed)
Sat O2 93% at rest on 4 L oxygen; patient was able to walk in the hallway - on 4 L oxygen Sat O2 77%; able to keep sat O2 above 88% with 6L - 8L.

## 2019-09-13 NOTE — Progress Notes (Addendum)
CCMD called and informed staff that patient had 30 beats of V-tach. Patient is asymptomatic, sitting in the chair, no complaints. MD was notified.

## 2019-09-14 ENCOUNTER — Other Ambulatory Visit (HOSPITAL_COMMUNITY): Payer: Commercial Managed Care - PPO

## 2019-09-14 LAB — BASIC METABOLIC PANEL
Anion gap: 11 (ref 5–15)
BUN: 14 mg/dL (ref 6–20)
CO2: 28 mmol/L (ref 22–32)
Calcium: 8.5 mg/dL — ABNORMAL LOW (ref 8.9–10.3)
Chloride: 94 mmol/L — ABNORMAL LOW (ref 98–111)
Creatinine, Ser: 0.82 mg/dL (ref 0.61–1.24)
GFR calc Af Amer: >60 mL/min (ref >60)
GFR calc non Af Amer: >60 mL/min (ref >60)
Glucose, Bld: 146 mg/dL — ABNORMAL HIGH (ref 70–99)
Potassium: 3.9 mmol/L (ref 3.5–5.1)
Sodium: 133 mmol/L — ABNORMAL LOW (ref 135–145)

## 2019-09-14 LAB — CBC
HCT: 45.5 % (ref 39.0–52.0)
Hemoglobin: 14.5 g/dL (ref 13.0–17.0)
MCH: 28.9 pg (ref 26.0–34.0)
MCHC: 31.9 g/dL (ref 30.0–36.0)
MCV: 90.8 fL (ref 80.0–100.0)
Platelets: 498 10*3/uL — ABNORMAL HIGH (ref 150–400)
RBC: 5.01 MIL/uL (ref 4.22–5.81)
RDW: 13.8 % (ref 11.5–15.5)
WBC: 12.2 10*3/uL — ABNORMAL HIGH (ref 4.0–10.5)
nRBC: 0 % (ref 0.0–0.2)

## 2019-09-14 LAB — GLUCOSE, CAPILLARY
Glucose-Capillary: 128 mg/dL — ABNORMAL HIGH (ref 70–99)
Glucose-Capillary: 187 mg/dL — ABNORMAL HIGH (ref 70–99)
Glucose-Capillary: 187 mg/dL — ABNORMAL HIGH (ref 70–99)
Glucose-Capillary: 253 mg/dL — ABNORMAL HIGH (ref 70–99)

## 2019-09-14 LAB — D-DIMER, QUANTITATIVE: D-Dimer, Quant: 1.23 ug/mL-FEU — ABNORMAL HIGH (ref 0.00–0.50)

## 2019-09-14 LAB — MAGNESIUM: Magnesium: 2 mg/dL (ref 1.7–2.4)

## 2019-09-14 MED ORDER — FUROSEMIDE 10 MG/ML IJ SOLN
60.0000 mg | Freq: Once | INTRAMUSCULAR | Status: AC
  Administered 2019-09-14: 60 mg via INTRAVENOUS
  Filled 2019-09-14: qty 6

## 2019-09-14 NOTE — Progress Notes (Signed)
SATURATION QUALIFICATIONS: (This note is used to comply with regulatory documentation for home oxygen)  Patient Saturations on Room Air at Rest = 80%  Patient Saturations on Room Air while Ambulating = 82%  Patient Saturations on 6 Liters of oxygen while Ambulating = 88%  Please briefly explain why patient needs home oxygen: Patient required 6L O2 while ambulating to maintain sats >88%.

## 2019-09-14 NOTE — Progress Notes (Signed)
PROGRESS NOTE    Thomas Silva  WVP:[id number] DOB: 1965-05-16 DOA: 09/01/2019 PCP: [name], [name], MD     Brief Narrative:   Thomas Silva is a 54 y.o. WM PMHx HLD.  Pt with several day h/o SOB and cough.  Presented to PCP's office today.  Tested COVID positive.  Pt has not had vaccine.   ED Course: Tm 102.7, CXR with bilateral infiltrates C/W COVID.  Desats to 87% on RA, currently satting well but on 4L via [place].   Subjective:  He denies any complaints, still requiring 6 to 8 L nasal cannula with activity, reports cough significantly improved .  Assessment & Plan: Covid vaccination; no vaccination   Active Problems:   Acute hypoxemic respiratory failure due to COVID-19 [facility])   Pneumonia due to COVID-19 virus   Community acquired pneumonia   HLD (hyperlipidemia)   Hypothyroidism   AKI (acute kidney injury) (HCC)  Acute respiratory failure with hypoxia/Covid 19 pneumonia -Patient imaging significant for multifocal opacity, he has improved oxygen requirement, he is on 2 to 4 L at rest, but requiring up to 6 to 8 L with activity, discussed with staff, will keep ambulating today, patient was encouraged to use incentive spirometry and flutter valve . -He was encouraged to use incentive spirometry, flutter valve, he was encouraged to get out of bed to chair, he will ambulate with staff today. -Treated with IV remdesivir. -Treated with IV steroids, initial total of 10 days. -Patient with history of hepatitis B, so he did not receive Actemra. -8/5 transfused 1 unit Covid Convalescent Plasma -8/8 patient's D-dimer trending up again will obtain CTA PE protocol and bilateral lower extremity Doppler to rule out PE/DVT -8/9 CTA PE protocol negative for PE see results below -8/10 bilateral lower extremity Doppler negative see results below -He is on Lasix as needed, will give 60 mg of IV Lasix today.  COVID-19 Labs  Recent Labs    09/12/19 0445 09/13/19 0907 09/14/19 0208   DDIMER 1.45* 1.33* 1.23*    Lab Results  Component Value Date   SARSCOV2NAA POSITIVE (A) 09/01/2019   CAP -He was treated with IV Rocephin and azithromycin  Hyperlipidemia --Crestor 10 mg daily  Hypothyroidism -Synthroid 25 mcg daily  DM type II uncontrolled with complication -8/4 hemoglobin A1c= 8.5 -Currently acceptable on current dose of Levemir, premeal NovoLog and sliding scale.   AKI (baseline Cr 1.0) Recent Labs  Lab 09/10/19 0123 09/11/19 0955 09/12/19 0445 09/13/19 0907 09/14/19 0208  CREATININE 0.76 1.07 0.86 1.04 0.82  -At baseline   GERD -Continue with PPI  There was a concern of 30 beats of V. tach yesterday on telemetry monitor, I have discussed with Dr. Mayford Knife who reviewed this telemetry strip, does appear to be an artifact, and not true V. Tach.   DVT prophylaxis: Lovenox Code Status: Full Family Communication: I have updated patient,  Status is: Inpatient    Dispo: The patient is from: Home              Anticipated d/c is to: Home              Anticipated d/c date is: 8/15              Patient currently unstable      Consultants:    Procedures/Significant Events negative DVT 8/2 PCXR;Bilateral streaky densities most consistent with COVID-19 Infiltrate. 8/8 CTA chest PE protocol;-negative PE 2. Very extensive bilateral, somewhat geographic heterogeneous and ground-glass airspace opacity throughout the lungs, consistent  with COVID-19 airspace disease. 3. Coronary artery disease. 8/9 lower extremity Doppler; negative DVT   I have personally reviewed and interpreted all radiology studies and my findings are as above.  VENTILATOR SETTINGS: HFNC 8/10  flow; 5 L/min SPO2; 91%    Cultures 8/2 SARS coronavirus positive 8/2 HIV screen negative 8/2 blood RIGHT AC NGTD 8/2 blood NGTD 8/4 virus panel negative   Antimicrobials: Anti-infectives (From admission, onward)   Start     Ordered Stop   09/03/19 1000  remdesivir  100 mg in sodium chloride 0.9 % 100 mL IVPB     Discontinue    "Followed by" Linked Group Details   09/01/19 2335 09/07/19 0959   09/02/19 2200  azithromycin (ZITHROMAX) 500 mg in sodium chloride 0.9 % 250 mL IVPB     Discontinue     09/02/19 0832 09/06/19 2159   09/02/19 2200  cefTRIAXone (ROCEPHIN) 1 g in sodium chloride 0.9 % 100 mL IVPB     Discontinue     09/02/19 0832     09/02/19 1600  valACYclovir (VALTREX) tablet 1,000 mg     Discontinue     09/02/19 1510     09/02/19 0100  remdesivir 200 mg in sodium chloride 0.9% 250 mL IVPB       "Followed by" Linked Group Details   09/01/19 2335 09/02/19 0119   09/01/19 2200  azithromycin (ZITHROMAX) 500 mg in sodium chloride 0.9 % 250 mL IVPB        09/01/19 2158 09/02/19 0033   09/01/19 2200  cefTRIAXone (ROCEPHIN) 1 g in sodium chloride 0.9 % 100 mL IVPB        09/01/19 2158 09/01/19 2306        Continuous Infusions:    Objective: Vitals:   09/13/19 2116 09/14/19 0505 09/14/19 0721 09/14/19 1414  BP: (!) 103/56 (!) 88/56 126/67 113/65  Pulse: 72 71  91  Resp: 18 15  17   Temp: 98 F (36.7 C) 98.1 F (36.7 C) 98.5 F (36.9 C) 97.7 F (36.5 C)  TempSrc: Axillary Oral Oral Oral  SpO2: 95% 94%  91%  Weight:      Height:        Intake/Output Summary (Last 24 hours) at 09/14/2019 1458 Last data filed at 09/14/2019 1100 Gross per 24 hour  Intake 240 ml  Output 2475 ml  Net -2235 ml   Filed Weights   09/01/19 1805 09/05/19 1938  Weight: 108.9 kg 108.9 kg   Physical Exam:  Awake Alert, Oriented X 3, No new F.N deficits, Normal affect Symmetrical Chest wall movement, Good air movement bilaterally, CTAB RRR,No Gallops,Rubs or new Murmurs, No Parasternal Heave +ve B.Sounds, Abd Soft, No tenderness, No rebound - guarding or rigidity. No Cyanosis, Clubbing or edema, No new Rash or bruise       CBC: Recent Labs  Lab 09/08/19 0255 09/08/19 0255 09/09/19 0500 09/09/19 0500 09/10/19 0123 09/11/19 0955 09/12/19 0445  09/13/19 0907 09/14/19 0208  WBC 7.6   < > 7.9   < > 9.9 10.0 12.6* 13.2* 12.2*  NEUTROABS 6.4  --  6.3  --  8.5*  --   --   --   --   HGB 14.4   < > 15.6   < > 15.4 15.9 16.1 14.6 14.5  HCT 45.9   < > 49.1   < > 48.8 50.7 49.5 46.8 45.5  MCV 90.4   < > 91.9   < > 89.9 90.2 91.0 91.4 90.8  PLT 446*   < > 485*   < > 561* 538* 568* 504* 498*   < > = values in this interval not displayed.   Basic Metabolic Panel: Recent Labs  Lab 09/08/19 0255 09/08/19 0255 09/09/19 0500 09/09/19 0500 09/10/19 0123 09/10/19 0123 09/11/19 0955 09/12/19 0445 09/13/19 0907 09/13/19 1803 09/14/19 0208  NA 137   < > 136   < > 135  --  135 135 132*  --  133*  K 4.1   < > 4.7   < > 4.1   < > 4.0 5.0 3.9 4.1 3.9  CL 100   < > 99   < > 97*  --  94* 95* 88*  --  94*  CO2 27   < > 24   < > 27  --  29 26 33*  --  28  GLUCOSE 148*   < > 114*   < > 132*  --  251* 125* 345*  --  146*  BUN 17   < > 20   < > 19  --  22* 22* 18  --  14  CREATININE 0.91   < > 0.71   < > 0.76  --  1.07 0.86 1.04  --  0.82  CALCIUM 8.6*   < > 8.8*   < > 9.1  --  9.0 8.8* 8.9  --  8.5*  MG 2.1  --  2.0  --  1.9  --   --   --   --  1.9 2.0  PHOS 3.6  --  4.0  --  4.2  --   --   --   --   --   --    < > = values in this interval not displayed.   GFR: Estimated Creatinine Clearance: 129.2 mL/min (by C-G formula based on SCr of 0.82 mg/dL). Liver Function Tests: Recent Labs  Lab 09/08/19 0255 09/09/19 0500 09/10/19 0123  AST 40 35 34  ALT 49* 48* 53*  ALKPHOS 53 49 53  BILITOT 0.5 0.9 0.6  PROT 6.4* 6.2* 6.5  ALBUMIN 2.6* 2.6* 2.7*   No results for input(s): LIPASE, AMYLASE in the last 168 hours. No results for input(s): AMMONIA in the last 168 hours. Coagulation Profile: No results for input(s): INR, PROTIME in the last 168 hours. Cardiac Enzymes: No results for input(s): CKTOTAL, CKMB, CKMBINDEX, TROPONINI in the last 168 hours. BNP (last 3 results) No results for input(s): PROBNP in the last 8760 hours. HbA1C: No  results for input(s): HGBA1C in the last 72 hours. CBG: Recent Labs  Lab 09/13/19 1159 09/13/19 1610 09/13/19 2134 09/14/19 0747 09/14/19 1210  GLUCAP 219* 253* 330* 128* 187*   Lipid Profile: No results for input(s): CHOL, HDL, LDLCALC, TRIG, CHOLHDL, LDLDIRECT in the last 72 hours. Thyroid Function Tests: No results for input(s): TSH, T4TOTAL, FREET4, T3FREE, THYROIDAB in the last 72 hours. Anemia Panel: No results for input(s): VITAMINB12, FOLATE, FERRITIN, TIBC, IRON, RETICCTPCT in the last 72 hours. Sepsis Labs: No results for input(s): PROCALCITON, LATICACIDVEN in the last 168 hours.  No results found for this or any previous visit (from the past 240 hour(s)).       Radiology Studies: No results found.      Scheduled Meds: . AeroChamber Plus Flo-Vu Large  1 each Other Once  . albuterol  2 puff Inhalation BID  . aspirin EC  81 mg Oral Daily  . enoxaparin (LOVENOX) injection  55 mg Subcutaneous  Daily  . feeding supplement (GLUCERNA SHAKE)  237 mL Oral BID BM  . folic acid  1,000 mcg Oral Daily  . gabapentin  300 mg Oral Daily  . gabapentin  600 mg Oral QHS  . insulin aspart  0-15 Units Subcutaneous TID WC  . insulin aspart  0-5 Units Subcutaneous QHS  . insulin aspart  10 Units Subcutaneous TID WC  . insulin detemir  14 Units Subcutaneous BID  . levothyroxine  25 mcg Oral QAC breakfast  . linagliptin  5 mg Oral Daily  . lisinopril  2.5 mg Oral Daily  . metoprolol tartrate  12.5 mg Oral BID  . pantoprazole  40 mg Oral Daily  . rosuvastatin  10 mg Oral QPM  . sodium chloride flush  3 mL Intravenous Once  . thiamine  100 mg Oral Daily  . zinc sulfate  220 mg Oral Daily   Continuous Infusions:    LOS: 13 days     Huey Bienenstock, MD [facility]  If 7PM-7AM, please contact night-coverage [email or web address] Password [facility] 09/14/2019, 2:58 PM

## 2019-09-15 ENCOUNTER — Inpatient Hospital Stay (HOSPITAL_COMMUNITY): Payer: Commercial Managed Care - PPO

## 2019-09-15 DIAGNOSIS — R609 Edema, unspecified: Secondary | ICD-10-CM

## 2019-09-15 DIAGNOSIS — U071 COVID-19: Secondary | ICD-10-CM

## 2019-09-15 LAB — GLUCOSE, CAPILLARY
Glucose-Capillary: 156 mg/dL — ABNORMAL HIGH (ref 70–99)
Glucose-Capillary: 151 mg/dL — ABNORMAL HIGH (ref 70–99)
Glucose-Capillary: 220 mg/dL — ABNORMAL HIGH (ref 70–99)
Glucose-Capillary: 205 mg/dL — ABNORMAL HIGH (ref 70–99)

## 2019-09-15 LAB — BASIC METABOLIC PANEL
Anion gap: 9 (ref 5–15)
BUN: 13 mg/dL (ref 6–20)
CO2: 32 mmol/L (ref 22–32)
Calcium: 8.3 mg/dL — ABNORMAL LOW (ref 8.9–10.3)
Chloride: 92 mmol/L — ABNORMAL LOW (ref 98–111)
Creatinine, Ser: 0.92 mg/dL (ref 0.61–1.24)
GFR calc Af Amer: >60 mL/min (ref >60)
GFR calc non Af Amer: >60 mL/min (ref >60)
Glucose, Bld: 162 mg/dL — ABNORMAL HIGH (ref 70–99)
Potassium: 3.6 mmol/L (ref 3.5–5.1)
Sodium: 133 mmol/L — ABNORMAL LOW (ref 135–145)

## 2019-09-15 LAB — CBC
HCT: 42.7 % (ref 39.0–52.0)
Hemoglobin: 13.8 g/dL (ref 13.0–17.0)
MCH: 29.3 pg (ref 26.0–34.0)
MCHC: 32.3 g/dL (ref 30.0–36.0)
MCV: 90.7 fL (ref 80.0–100.0)
Platelets: 411 10*3/uL — ABNORMAL HIGH (ref 150–400)
RBC: 4.71 MIL/uL (ref 4.22–5.81)
RDW: 13.8 % (ref 11.5–15.5)
WBC: 12.7 10*3/uL — ABNORMAL HIGH (ref 4.0–10.5)
nRBC: 0 % (ref 0.0–0.2)

## 2019-09-15 LAB — D-DIMER, QUANTITATIVE: D-Dimer, Quant: 1.68 ug/mL-FEU — ABNORMAL HIGH (ref 0.00–0.50)

## 2019-09-15 MED ORDER — ACETAMINOPHEN 325 MG PO TABS: 650.0000 mg | ORAL_TABLET | Freq: Four times a day (QID) | ORAL | Status: DC | PRN

## 2019-09-15 MED ORDER — POTASSIUM CHLORIDE CRYS ER 20 MEQ PO TBCR
40.0000 meq | EXTENDED_RELEASE_TABLET | Freq: Once | ORAL | Status: AC
  Administered 2019-09-15: 40 meq via ORAL
  Filled 2019-09-15: qty 2

## 2019-09-15 NOTE — Discharge Instructions (Signed)
Person Under Monitoring Name: Thomas Silva  Location: [address] [place] [facility] [address]   Infection Prevention Recommendations for Individuals Confirmed to have, or Being Evaluated for, 2019 Novel Coronavirus (COVID-19) Infection Who Receive Care at Home  Individuals who are confirmed to have, or are being evaluated for, COVID-19 should follow the prevention steps below until a healthcare provider or local or state health department says they can return to normal activities.  Stay home except to get medical care You should restrict activities outside your home, except for getting medical care. Do not go to work, school, or public areas, and do not use public transportation or taxis.  Call ahead before visiting your doctor Before your medical appointment, call the healthcare provider and tell them that you have, or are being evaluated for, COVID-19 infection. This will help the healthcare provider's office take steps to keep other people from getting infected. Ask your healthcare provider to call the local or state health department.  Monitor your symptoms Seek prompt medical attention if your illness is worsening (e.g., difficulty breathing). Before going to your medical appointment, call the healthcare provider and tell them that you have, or are being evaluated for, COVID-19 infection. Ask your healthcare provider to call the local or state health department.  Wear a facemask You should wear a facemask that covers your nose and mouth when you are in the same room with other people and when you visit a healthcare provider. People who live with or visit you should also wear a facemask while they are in the same room with you.  Separate yourself from other people in your home As much as possible, you should stay in a different room from other people in your home. Also, you should use a separate bathroom, if available.  Avoid sharing household items You should not  share dishes, drinking glasses, cups, eating utensils, towels, bedding, or other items with other people in your home. After using these items, you should wash them thoroughly with soap and water.  Cover your coughs and sneezes Cover your mouth and nose with a tissue when you cough or sneeze, or you can cough or sneeze into your sleeve. Throw used tissues in a lined trash can, and immediately wash your hands with soap and water for at least 20 seconds or use an alcohol-based hand rub.  Wash your [employer] your hands often and thoroughly with soap and water for at least 20 seconds. You can use an alcohol-based hand sanitizer if soap and water are not available and if your hands are not visibly dirty. Avoid touching your eyes, nose, and mouth with unwashed hands.   Prevention Steps for Caregivers and Household Members of Individuals Confirmed to have, or Being Evaluated for, COVID-19 Infection Being Cared for in the Home  If you live with, or provide care at home for, a person confirmed to have, or being evaluated for, COVID-19 infection please follow these guidelines to prevent infection:  Follow healthcare provider's instructions Make sure that you understand and can help the patient follow any healthcare provider instructions for all care.  Provide for the patient's basic needs You should help the patient with basic needs in the home and provide support for getting groceries, prescriptions, and other personal needs.  Monitor the patient's symptoms If they are getting sicker, call his or her medical provider and tell them that the patient has, or is being evaluated for, COVID-19 infection. This will help the healthcare provider's office  take steps to keep other people from getting infected. Ask the healthcare provider to call the local or state health department.  Limit the number of people who have contact with the patient  If possible, have only one caregiver for the  patient.  Other household members should stay in another home or place of residence. If this is not possible, they should stay  in another room, or be separated from the patient as much as possible. Use a separate bathroom, if available.  Restrict visitors who do not have an essential need to be in the home.  Keep older adults, very young children, and other sick people away from the patient Keep older adults, very young children, and those who have compromised immune systems or chronic health conditions away from the patient. This includes people with chronic heart, lung, or kidney conditions, diabetes, and cancer.  Ensure good ventilation Make sure that shared spaces in the home have good air flow, such as from an air conditioner or an opened window, weather permitting.  Wash your hands often  Wash your hands often and thoroughly with soap and water for at least 20 seconds. You can use an alcohol based hand sanitizer if soap and water are not available and if your hands are not visibly dirty.  Avoid touching your eyes, nose, and mouth with unwashed hands.  Use disposable paper towels to dry your hands. If not available, use dedicated cloth towels and replace them when they become wet.  Wear a facemask and gloves  Wear a disposable facemask at all times in the room and gloves when you touch or have contact with the patient's blood, body fluids, and/or secretions or excretions, such as sweat, saliva, sputum, nasal mucus, vomit, urine, or feces.  Ensure the mask fits over your nose and mouth tightly, and do not touch it during use.  Throw out disposable facemasks and gloves after using them. Do not reuse.  Wash your hands immediately after removing your facemask and gloves.  If your personal clothing becomes contaminated, carefully remove clothing and launder. Wash your hands after handling contaminated clothing.  Place all used disposable facemasks, gloves, and other waste in a lined  container before disposing them with other household waste.  Remove gloves and wash your hands immediately after handling these items.  Do not share dishes, glasses, or other household items with the patient  Avoid sharing household items. You should not share dishes, drinking glasses, cups, eating utensils, towels, bedding, or other items with a patient who is confirmed to have, or being evaluated for, COVID-19 infection.  After the person uses these items, you should wash them thoroughly with soap and water.  Wash laundry thoroughly  Immediately remove and wash clothes or bedding that have blood, body fluids, and/or secretions or excretions, such as sweat, saliva, sputum, nasal mucus, vomit, urine, or feces, on them.  Wear gloves when handling laundry from the patient.  Read and follow directions on labels of laundry or clothing items and detergent. In general, wash and dry with the warmest temperatures recommended on the label.  Clean all areas the individual has used often  Clean all touchable surfaces, such as counters, tabletops, doorknobs, bathroom fixtures, toilets, phones, keyboards, tablets, and bedside tables, every day. Also, clean any surfaces that may have blood, body fluids, and/or secretions or excretions on them.  Wear gloves when cleaning surfaces the patient has come in contact with.  Use a diluted bleach solution (e.g., dilute bleach with 1 part  bleach and 10 parts water) or a household disinfectant with a label that says EPA-registered for coronaviruses. To make a bleach solution at home, add 1 tablespoon of bleach to 1 quart (4 cups) of water. For a larger supply, add  cup of bleach to 1 gallon (16 cups) of water.  Read labels of cleaning products and follow recommendations provided on product labels. Labels contain instructions for safe and effective use of the cleaning product including precautions you should take when applying the product, such as wearing gloves or  eye protection and making sure you have good ventilation during use of the product.  Remove gloves and wash hands immediately after cleaning.  Monitor yourself for signs and symptoms of illness Caregivers and household members are considered close contacts, should monitor their health, and will be asked to limit movement outside of the home to the extent possible. Follow the monitoring steps for close contacts listed on the symptom monitoring form.   ? If you have additional questions, contact your local health department or call the epidemiologist on call at [phone] (available 24/7). ? This guidance is subject to change. For the most up-to-date guidance from [facility], please refer to their website: YouBlogs.pl

## 2019-09-15 NOTE — Progress Notes (Signed)
Patient states O2 concentrator delivered to his home was set at 2 L per family friend who met O2 company and received education on device.

## 2019-09-15 NOTE — Progress Notes (Signed)
Contacted MD about new order for continuous O2 via portable concentrator.  Once order is received, staff needs to call [employer] after hours number to see if it can be delivered tonight. [phone].

## 2019-09-15 NOTE — Plan of Care (Signed)
°  Problem: Education: Goal: Knowledge of risk factors and measures for prevention of condition will improve 09/15/2019 1429 by Luellen Pucker, RN Outcome: Adequate for Discharge 09/15/2019 1102 by Luellen Pucker, RN Outcome: Progressing   Problem: Coping: Goal: Psychosocial and spiritual needs will be supported Outcome: Adequate for Discharge   Problem: Respiratory: Goal: Will maintain a patent airway Outcome: Adequate for Discharge Oxygen delivered to home and to hospital room. Goal: Complications related to the disease process, condition or treatment will be avoided or minimized Outcome: Adequate for Discharge  Patient verbalizes understanding of infection precautions. Problem: Education: Goal: Knowledge of General Education information will improve Description: Including pain rating scale, medication(s)/side effects and non-pharmacologic comfort measures 09/15/2019 1429 by Luellen Pucker, RN Outcome: Adequate for Discharge 09/15/2019 1102 by Luellen Pucker, RN Outcome: Progressing   Problem: Activity: Goal: Risk for activity intolerance will decrease 09/15/2019 1429 by Luellen Pucker, RN Outcome: Adequate for Discharge 09/15/2019 1102 by Luellen Pucker, RN Outcome: Progressing   Problem: Nutrition: Goal: Adequate nutrition will be maintained 09/15/2019 1429 by Luellen Pucker, RN Outcome: Adequate for Discharge 09/15/2019 1102 by Luellen Pucker, RN Outcome: Progressing

## 2019-09-15 NOTE — Progress Notes (Signed)
Pt states that his friend states that the O2 concentrator delivered to his home is only set on 2L of O2. Pt in agreement with staying tonight due to O2 device issues and to be d/c in the morning. Notified Dr. Leafy Half that pt refusing telemetry tonight. Dr. Leafy Half stated that it is ok to keep pt off telemetry tonight since pt will d/c in the morning. Will continue to monitor pt. Nelda Marseille, RN

## 2019-09-15 NOTE — Progress Notes (Signed)
When providing discharge information, patient asked how oxygen tank delivered by oxygen company works. RN attempted to show patient and noticed tank with possible leak, also tank did not seem to function properly at 6L. Oxygen tank appears to pulse but not deliver continuous flow at 6 L. Patient discharge plan is to drive self home.  Charge RN contacting oxygen company.

## 2019-09-15 NOTE — Plan of Care (Signed)
  Problem: Education: Goal: Knowledge of risk factors and measures for prevention of condition will improve Outcome: Progressing   Problem: Education: Goal: Knowledge of General Education information will improve Description: Including pain rating scale, medication(s)/side effects and non-pharmacologic comfort measures Outcome: Progressing   Problem: Activity: Goal: Risk for activity intolerance will decrease Outcome: Progressing   Reviewed discharge planning, activity required to manage needs at home, progressive ambulation, set up of ADL's at home. Problem: Nutrition: Goal: Adequate nutrition will be maintained Outcome: Progressing  Patient eats 100% of meals and snacks.

## 2019-09-15 NOTE — Progress Notes (Signed)
Pt states that his friend states that the O2 concentrator delivered to his home is only set on 2L of O2. Pt in agreement with staying tonight due to O2 device issues and to be d/c in the morning. Notified Dr. Leafy Half that pt refusing telemetry tonight. Will continue to monitor pt. Nelda Marseille, RN

## 2019-09-15 NOTE — Progress Notes (Signed)
Lower extremity venous has been completed.   Preliminary results in CV Proc.   Blanch Media 09/15/2019 1:55 PM

## 2019-09-15 NOTE — Progress Notes (Signed)
Patient ambulated in hall for 10 mins on 6 L O2, saturations 90-96%.  Patient now in room on 5L at 93-94%.

## 2019-09-15 NOTE — Progress Notes (Signed)
Occupational Therapy Treatment Patient Details Name: Thomas Silva MRN: [id number] DOB: 05/24/1965 Today's Date: 09/15/2019    History of present illness Pt is a 54 y.o. male with PMH of HLD, DM, hep B, admitted 09/01/19 with several days of SOB and cough. Covid +/no vaccine. CXR with bilateral infiltrates. Workup for hypoxic respiratory failure, COVID PNA, AKI.   OT comments  Pt progressing towards established OT goals and eager to dc to home. Providing pt with education and handout on energy conservation for ADLs and IADLs. Pt verbalized ways he already uses EC techniques and ways he plans to implement new techniques into daily routine. Reviewed use of IS and flutter valve; encouraging pt to perform ten times each hours (as he has been doing it "as I can"). Pt with cough throughout session and requesting something for cough; RN notified. Continue to recommend dc to home once medically stable and will sign off.    Follow Up Recommendations  No OT follow up;Supervision - Intermittent    Equipment Recommendations  None recommended by OT    Recommendations for Other Services      Precautions / Restrictions Precautions Precautions: Other (comment) Precaution Comments: Watch SpO2       Mobility Bed Mobility               General bed mobility comments: Received sitting in recliner  Transfers Overall transfer level: Independent                    Balance Overall balance assessment: No apparent balance deficits (not formally assessed)                                         ADL either performed or assessed with clinical judgement   ADL Overall ADL's : Needs assistance/impaired                                       General ADL Comments: Provided pt with education and handout on energy conservation techniques. Pt verbalizing ways he plans to implement techniques into daily routine.      Vision       Perception     Praxis       Cognition Arousal/Alertness: Awake/alert Behavior During Therapy: WFL for tasks assessed/performed Overall Cognitive Status: Within Functional Limits for tasks assessed                                          Exercises     Shoulder Instructions       General Comments Reviewed use of flutter valve and IS. Pt verbalizing sequence for use.     Pertinent Vitals/ Pain          Home Living                                          Prior Functioning/Environment              Frequency  Min 2X/week        Progress Toward Goals  OT Goals(current goals can now be found in the care plan section)  Progress towards OT goals: Progressing toward goals  Acute Rehab OT Goals Patient Stated Goal: to get better and go home OT Goal Formulation: With patient Time For Goal Achievement: 09/24/19 Potential to Achieve Goals: Good ADL Goals Pt Will Perform Lower Body Bathing: Independently;sit to/from stand Pt Will Perform Lower Body Dressing: Independently;sit to/from stand Pt Will Transfer to Toilet: Independently;ambulating Pt Will Perform Tub/Shower Transfer: Tub transfer;ambulating Pt/caregiver will Perform Home Exercise Program: Increased strength;Both right and left upper extremity;Independently;With written HEP provided;With theraband Additional ADL Goal #1: Pt will independently demonstrate pursed lip breathign techniques Additional ADL Goal #2: Pt will independently verbalize 3 energy conservation strategies  Plan Discharge plan remains appropriate    Co-evaluation                 AM-PAC OT "6 Clicks" Daily Activity     Outcome Measure   Help from another person eating meals?: None Help from another person taking care of personal grooming?: A Little Help from another person toileting, which includes using toliet, bedpan, or urinal?: A Little Help from another person bathing (including washing, rinsing, drying)?: A Little Help  from another person to put on and taking off regular upper body clothing?: A Little Help from another person to put on and taking off regular lower body clothing?: A Little 6 Click Score: 19    End of Session Equipment Utilized During Treatment: Oxygen (2L)  OT Visit Diagnosis: Unsteadiness on feet (R26.81)   Activity Tolerance Patient tolerated treatment well   Patient Left in chair;with call bell/phone within reach   Nurse Communication Mobility status        Time: 6962-9528 OT Time Calculation (min): 16 min  Charges: OT General Charges $OT Visit: 1 Visit OT Treatments $Self Care/Home Management : 8-22 mins  Retal Tonkinson MSOT, OTR/L Acute Rehab Pager: [phone] Office: [phone]   Theodoro Grist Cherae Marton 09/15/2019, 1:04 PM

## 2019-09-15 NOTE — Progress Notes (Signed)
   09/15/19 0424  Vitals  BP 99/60  MAP (mmHg) 72  Pulse Rate 68  ECG Heart Rate 76  Resp 17  Level of Consciousness  Level of Consciousness Alert  Oxygen Therapy  SpO2 94 %  O2 Device HFNC  O2 Flow Rate (L/min) 4 L/min  Pt having multiple PVC's.  Pt sleeping and in no obvious distress with no complaints. MD notified Will continue to monitor pt. Margarette Asal, RN 09/15/2019 - [phone]

## 2019-09-15 NOTE — Progress Notes (Signed)
[employer] states that order was written for 2L of continuous home oxygen. New order needs to be placed for 6L continuous oxygen so that concentrator can be delivered.

## 2019-09-15 NOTE — TOC Transition Note (Signed)
Transition of Care [facility]) - CM/SW Discharge Note   Patient Details  Name: Thomas Silva MRN: [id number] Date of Birth: 17-Jan-1966  [facility] CM/SW Contact:  Lockie Pares, RN Phone Number: 09/15/2019, 7:53 PM   Clinical Narrative:     Problem with  oxygen tank leaking  Asked RN to have RT check it out. Spoke to Rep [place] at [employer] He is willing to bring a tank up. If need be to get the patient discharged, however the patient requires 6L and his order is for 2L . Marland Kitchen The patients oxygen at home is also set for 2L patient will stay tonight to make sue all settings on oxygen are correct and he can safely discharge in the morning.   Final next level of care: Home/Self Care     Patient Goals and CMS Choice        Discharge Placement                       Discharge Plan and Services                DME Arranged: Oxygen DME Agency:  [facility]) Date DME Agency Contacted: 09/13/19 Time DME Agency Contacted: [phone] Representative spoke with at DME Agency: jermaine            Social Determinants of Health (SDOH) Interventions     Readmission Risk Interventions No flowsheet data found.

## 2019-09-15 NOTE — Discharge Summary (Addendum)
Thomas Silva, is a 54 y.o. male  DOB September 25, 1965  MRN [id number].  Admission date:  09/01/2019  Admitting Physician  Hillary Bow, DO  Discharge Date:  09/15/2019   Primary MD  Street, [name], MD   -No significant update on discharge summary on 09/15/2019, patient was seen and examined today, patient was not discharged as oxygen tank delivered yesterday was leaking, as well patient needed a concentrator, he remains on 2 L at rest, but with activity he requires up to 6 L for which he is requiring concentrator, which has been delivered today.  Recommendations for primary care physician for things to follow:  -Please check CBC, CMP during next visit.   Admission Diagnosis  Acute respiratory failure with hypoxia (HCC) [J96.01] Acute hypoxemic respiratory failure due to COVID-19 (HCC) [U07.1, J96.01] Pneumonia due to COVID-19 virus [U07.1, J12.82]   Discharge Diagnosis  Acute respiratory failure with hypoxia (HCC) [J96.01] Acute hypoxemic respiratory failure due to COVID-19 (HCC) [U07.1, J96.01] Pneumonia due to COVID-19 virus [U07.1, J12.82]   Active Problems:   Acute hypoxemic respiratory failure due to COVID-19 [facility])   Pneumonia due to COVID-19 virus   Community acquired pneumonia   HLD (hyperlipidemia)   Hypothyroidism   AKI (acute kidney injury) (HCC)      Past Medical History:  Diagnosis Date  . HLD (hyperlipidemia)     Past Surgical History:  Procedure Laterality Date  . VENTRAL HERNIA REPAIR  2017       History of present illness and  Hospital Course:     Kindly see H&P for history of present illness and admission details, please review complete Labs, Consult reports and Test reports for all details in brief  HPI  from the history and physical done on the day of admission 09/14/2019   HPI: Thomas Silva is a 54 y.o. male with medical history significant of HLD.  Pt with  several day h/o SOB and cough.  Presented to PCP's office today.  Tested COVID positive.  Pt has not had vaccine.   ED Course: Tm 102.7, CXR with bilateral infiltrates C/W COVID.  Desats to 87% on RA, currently satting well but on 4L via [place].    Hospital Course   Acute respiratory failure with hypoxia/Covid 19 pneumonia -Patient imaging significant for multifocal opacity, he is with severe COVID-19 for pneumonia, significant oxygen requirement, but this has significantly improved, he is currently requiring 2 L nasal cannula at rest, and 6 L with activity, he was encouraged to keep using incentive spirometry, and flutter valve at home, and to stay active. -Treated with 5 days of IV remdesivir, he was treated with total of 10 days of IV steroids, no indication for further steroids on discharge. -Was encouraged to use incentive spirometry and flutter valve, to take these devices with him and keep using them at home -Patient with history of hepatitis B, so he did not receive Actemra. -8/5 transfused 1 unit Covid Convalescent Plasma -8/8 patient's D-dimer trending up again will obtain CTA PE protocol  and bilateral lower extremity Doppler to rule out PE/DVT -8/9 CTA PE protocol negative for PE  -8/10 bilateral lower extremity Doppler negative, as well he had repeat venous Doppler of left lower extremity today for discharge giving some edema in left lower extremity, but this was negative for DVT as well. -As well he did require as needed diuresis with IV Lasix, no further diuresis indicated at time of discharge.  CAP -He was treated with IV Rocephin and azithromycin  Hyperlipidemia --Crestor 10 mg daily  Hypothyroidism -Synthroid 25 mcg daily  DM type II uncontrolled with complication -8/4 hemoglobin A1c= 8.5 -Resume home regimen on discharge  GERD -Continue with PPI  AKI -Resolved  There was a concern of 30 beats of V. tach 8/14 on telemetry monitor, I have discussed with Dr.  Mayford Knife who reviewed this telemetry strip, it does appear to be an artifact, and not true V. Tach  Discharge Condition:  stable   Follow UP   Follow-up Information    [facility] Follow up.   Why: for home oxygen Contact information: [address] [place] [address] [phone]                 Discharge Instructions  and  Discharge Medications    Discharge Instructions    Discharge instructions   Complete by: As directed    Follow with Primary MD Street, [name], MD in 10 days   Get CBC, CMP, 2 view Chest X ray checked  by Primary MD next visit.    Activity: As tolerated with Full fall precautions use walker/cane & assistance as needed   Disposition Home    Diet: Heart Healthy/low-salt/carb modified.  For Heart failure patients - Check your Weight same time everyday, if you gain over 2 pounds, or you develop in leg swelling, experience more shortness of breath or chest pain, call your Primary MD immediately. Follow Cardiac Low Salt Diet and 1.5 lit/day fluid restriction.   On your next visit with your primary care physician please Get Medicines reviewed and adjusted.   Please request your Prim.MD to go over all Hospital Tests and Procedure/Radiological results at the follow up, please get all Hospital records sent to your Prim MD by signing hospital release before you go home.   If you experience worsening of your admission symptoms, develop shortness of breath, life threatening emergency, suicidal or homicidal thoughts you must seek medical attention immediately by calling 911 or calling your MD immediately  if symptoms less severe.  You Must read complete instructions/literature along with all the possible adverse reactions/side effects for all the Medicines you take and that have been prescribed to you. Take any new Medicines after you have completely understood and accpet all the possible adverse reactions/side effects.   Do not  drive, operating heavy machinery, perform activities at heights, swimming or participation in water activities or provide baby sitting services if your were admitted for syncope or siezures until you have seen by Primary MD or a Neurologist and advised to do so again.  Do not drive when taking Pain medications.    Do not take more than prescribed Pain, Sleep and Anxiety Medications  Special Instructions: If you have smoked or chewed Tobacco  in the last 2 yrs please stop smoking, stop any regular Alcohol  and or any Recreational drug use.  Wear Seat belts while driving.   Please note  You were cared for by a hospitalist during your hospital stay. If you have  any questions about your discharge medications or the care you received while you were in the hospital after you are discharged, you can call the unit and asked to speak with the hospitalist on call if the hospitalist that took care of you is not available. Once you are discharged, your primary care physician will handle any further medical issues. Please note that NO REFILLS for any discharge medications will be authorized once you are discharged, as it is imperative that you return to your primary care physician (or establish a relationship with a primary care physician if you do not have one) for your aftercare needs so that they can reassess your need for medications and monitor your lab values.   Increase activity slowly   Complete by: As directed      Allergies as of 09/15/2019      Reactions   Tramadol Nausea And Vomiting   Other reaction(s): Other (See Comments) Unknown      Medication List    STOP taking these medications   Valtrex 1000 MG tablet Generic drug: valACYclovir     TAKE these medications   acetaminophen 325 MG tablet Commonly known as: TYLENOL Take 2 tablets (650 mg total) by mouth every 6 (six) hours as needed for mild pain or headache (fever >/= 101).   amitriptyline 25 MG tablet Commonly known as:  ELAVIL Take 50 mg by mouth at bedtime as needed for sleep.   aspirin EC 81 MG tablet Take 81 mg by mouth daily. Swallow whole.   cetirizine 10 MG tablet Commonly known as: ZYRTEC Take 10 mg by mouth daily.   dexlansoprazole 60 MG capsule Commonly known as: DEXILANT Take 60 mg by mouth daily.   folic acid 800 MCG tablet Commonly known as: FOLVITE Take 800 mcg by mouth daily.   gabapentin 300 MG capsule Commonly known as: NEURONTIN Take 300-600 mg by mouth See admin instructions. Take 1 capsule in the morning, and 2 capsules at night   Jardiance 25 MG Tabs tablet Generic drug: empagliflozin Take 25 mg by mouth every morning.   levothyroxine 25 MCG tablet Commonly known as: SYNTHROID Take 25 mcg by mouth daily before breakfast.   lisinopril 2.5 MG tablet Commonly known as: ZESTRIL Take 2.5 mg by mouth daily.   metFORMIN 500 MG 24 hr tablet Commonly known as: GLUCOPHAGE-XR Take 1,000 mg by mouth 2 (two) times daily.   QUNOL ULTRA COQ10 PO Take 2 capsules by mouth daily.   rosuvastatin 10 MG tablet Commonly known as: CRESTOR Take 10 mg by mouth every evening.   testosterone cypionate 200 MG/ML injection Commonly known as: DEPOTESTOSTERONE CYPIONATE Inject 200 mg into the muscle every 14 (fourteen) days.   Tradjenta 5 MG Tabs tablet Generic drug: linagliptin Take 5 mg by mouth daily.            Durable Medical Equipment  (From admission, onward)         Start     Ordered   09/13/19 1257  For home use only DME oxygen  Once       Question Answer Comment  Length of Need 6 Months   Mode or (Route) Nasal cannula   Liters per Minute 2   Frequency Continuous (stationary and portable oxygen unit needed)   Oxygen conserving device Yes   Oxygen delivery system Gas      09/13/19 1256   09/13/19 1225  For home use only DME oxygen  Once       Question Answer  Comment  Length of Need 6 Months   Mode or (Route) Nasal cannula   Liters per Minute 2   Frequency  Continuous (stationary and portable oxygen unit needed)   Oxygen conserving device Yes   Oxygen delivery system Gas      09/13/19 1224            Diet and Activity recommendation: See Discharge Instructions above   Consults obtained -  None   Major procedures and Radiology Reports - PLEASE review detailed and final reports for all details, in brief -     CT ANGIO CHEST PE W OR WO CONTRAST  Result Date: 09/07/2019 CLINICAL DATA:  COVID positive, shortness of breath, rule out PE EXAM: CT ANGIOGRAPHY CHEST WITH CONTRAST TECHNIQUE: Multidetector CT imaging of the chest was performed using the standard protocol during bolus administration of intravenous contrast. Multiplanar CT image reconstructions and MIPs were obtained to evaluate the vascular anatomy. CONTRAST:  4mL OMNIPAQUE IOHEXOL 350 MG/ML SOLN COMPARISON:  None. FINDINGS: Cardiovascular: Satisfactory opacification of the pulmonary arteries to the segmental level. No evidence of pulmonary embolism. Normal heart size. Left coronary artery calcifications. No pericardial effusion. Mediastinum/Nodes: No enlarged mediastinal, hilar, or axillary lymph nodes. Thyroid gland, trachea, and esophagus demonstrate no significant findings. Lungs/Pleura: Very extensive bilateral, somewhat geographic heterogeneous and ground-glass airspace opacity throughout the lungs. No pleural effusion or pneumothorax. Upper Abdomen: No acute abnormality. Musculoskeletal: No chest wall abnormality. No acute or significant osseous findings. Review of the MIP images confirms the above findings. IMPRESSION: 1. Negative examination for pulmonary embolism. 2. Very extensive bilateral, somewhat geographic heterogeneous and ground-glass airspace opacity throughout the lungs, consistent with COVID-19 airspace disease. 3. Coronary artery disease. Electronically Signed   By: Lauralyn Primes M.D.   On: 09/07/2019 18:29   DG Chest Portable 1 View  Result Date: 09/01/2019 CLINICAL  DATA:  54 year old male with positive COVID-19 EXAM: PORTABLE CHEST 1 VIEW COMPARISON:  Chest radiograph dated 05/07 FINDINGS: Bilateral streaky densities most consistent with infiltrate, likely viral or atypical in etiology and in keeping with COVID-19. Clinical correlation is recommended. No pleural effusion or pneumothorax. The cardiac silhouette is within limits. No acute osseous pathology. IMPRESSION: Bilateral streaky densities most consistent with COVID-19 infiltrate. Electronically Signed   By: Elgie Collard M.D.   On: 09/01/2019 18:12   VAS [place] LOWER EXTREMITY VENOUS (DVT)  Result Date: 09/15/2019  Lower Venous DVTStudy Indications: Edema, and covid +.  Comparison Study: 09/08/19 previous Performing Technologist: Blanch Media RVS  Examination Guidelines: A complete evaluation includes B-mode imaging, spectral Doppler, color Doppler, and power Doppler as needed of all accessible portions of each vessel. Bilateral testing is considered an integral part of a complete examination. Limited examinations for reoccurring indications may be performed as noted. The reflux portion of the exam is performed with the patient in reverse Trendelenburg.  +-----+---------------+---------+-----------+----------+--------------+ RIGHTCompressibilityPhasicitySpontaneityPropertiesThrombus Aging +-----+---------------+---------+-----------+----------+--------------+ CFV  Full           Yes      Yes                                 +-----+---------------+---------+-----------+----------+--------------+   +---------+---------------+---------+-----------+----------+--------------+ LEFT     CompressibilityPhasicitySpontaneityPropertiesThrombus Aging +---------+---------------+---------+-----------+----------+--------------+ CFV      Full           Yes      Yes                                 +---------+---------------+---------+-----------+----------+--------------+  SFJ      Full                                                         +---------+---------------+---------+-----------+----------+--------------+ FV Prox  Full                                                        +---------+---------------+---------+-----------+----------+--------------+ FV Mid   Full                                                        +---------+---------------+---------+-----------+----------+--------------+ FV DistalFull                                                        +---------+---------------+---------+-----------+----------+--------------+ PFV      Full                                                        +---------+---------------+---------+-----------+----------+--------------+ POP      Full           Yes      Yes                                 +---------+---------------+---------+-----------+----------+--------------+ PTV      Full                                                        +---------+---------------+---------+-----------+----------+--------------+ PERO     Full                                                        +---------+---------------+---------+-----------+----------+--------------+     Summary: RIGHT: - No evidence of common femoral vein obstruction.  LEFT: - There is no evidence of deep vein thrombosis in the lower extremity.  - No cystic structure found in the popliteal fossa.  *See table(s) above for measurements and observations.    Preliminary    VAS [place] LOWER EXTREMITY VENOUS (DVT)  Result Date: 09/08/2019  Lower Venous DVTStudy Other Indications: Covid positive, D-dimer trending up evaluate for DVT. Comparison Study: no prior Performing Technologist: Blanch Media RVS  Examination Guidelines: A complete evaluation includes B-mode imaging, spectral Doppler, color Doppler, and power Doppler as needed of all accessible portions of each vessel.  Bilateral testing is considered an integral part of a complete examination. Limited  examinations for reoccurring indications may be performed as noted. The reflux portion of the exam is performed with the patient in reverse Trendelenburg.  +---------+---------------+---------+-----------+----------+--------------+ RIGHT    CompressibilityPhasicitySpontaneityPropertiesThrombus Aging +---------+---------------+---------+-----------+----------+--------------+ CFV      Full           Yes      Yes                                 +---------+---------------+---------+-----------+----------+--------------+ SFJ      Full                                                        +---------+---------------+---------+-----------+----------+--------------+ FV Prox  Full                                                        +---------+---------------+---------+-----------+----------+--------------+ FV Mid   Full                                                        +---------+---------------+---------+-----------+----------+--------------+ FV DistalFull                                                        +---------+---------------+---------+-----------+----------+--------------+ PFV      Full                                                        +---------+---------------+---------+-----------+----------+--------------+ POP      Full           Yes      Yes                                 +---------+---------------+---------+-----------+----------+--------------+ PTV      Full                                                        +---------+---------------+---------+-----------+----------+--------------+ PERO     Full                                                        +---------+---------------+---------+-----------+----------+--------------+   +---------+---------------+---------+-----------+----------+--------------+ LEFT     CompressibilityPhasicitySpontaneityPropertiesThrombus Aging  +---------+---------------+---------+-----------+----------+--------------+  CFV      Full           Yes      Yes                                 +---------+---------------+---------+-----------+----------+--------------+ SFJ      Full                                                        +---------+---------------+---------+-----------+----------+--------------+ FV Prox  Full                                                        +---------+---------------+---------+-----------+----------+--------------+ FV Mid   Full                                                        +---------+---------------+---------+-----------+----------+--------------+ FV DistalFull                                                        +---------+---------------+---------+-----------+----------+--------------+ PFV      Full                                                        +---------+---------------+---------+-----------+----------+--------------+ POP      Full           Yes      Yes                                 +---------+---------------+---------+-----------+----------+--------------+ PTV      Full                                                        +---------+---------------+---------+-----------+----------+--------------+ PERO     Full                                                        +---------+---------------+---------+-----------+----------+--------------+     Summary: BILATERAL: - No evidence of deep vein thrombosis seen in the lower extremities, bilaterally. - No evidence of superficial venous thrombosis in the lower extremities, bilaterally. -   *See table(s) above for measurements and observations. Electronically signed by Fabienne Bruns MD on 09/08/2019 at 5:13:44 PM.    Final  Micro Results   No results found for this or any previous visit (from the past 240 hour(s)).     Today   Subjective:   Thomas Silva today has no headache,no  chest abdominal pain,no new weakness tingling or numbness, feels much better wants to go home today.  Objective:   Blood pressure 102/61, pulse 91, temperature 98.4 F (36.9 C), temperature source Oral, resp. rate 16, height 5\' 11"  (1.803 m), weight 108.9 kg, SpO2 95 %.   Intake/Output Summary (Last 24 hours) at 09/15/2019 1401 Last data filed at 09/15/2019 1100 Gross per 24 hour  Intake 1520 ml  Output 3475 ml  Net -1955 ml    Exam Awake Alert, Oriented x 3, No new F.N deficits, Normal affect.  Symmetrical Chest wall movement, Good air movement bilaterally, CTAB RRR,No Gallops,Rubs or new Murmurs, No Parasternal Heave +ve B.Sounds, Abd Soft, Non tender,No rebound -guarding or rigidity. No Cyanosis, Clubbing, has mild edema in left lower extremity (venous Dopplers negative for DVT today), No new Rash or bruise  Data Review   CBC w Diff:  Lab Results  Component Value Date   WBC 12.7 (H) 09/15/2019   HGB 13.8 09/15/2019   HCT 42.7 09/15/2019   PLT 411 (H) 09/15/2019   LYMPHOPCT 4 09/10/2019   MONOPCT 6 09/10/2019   EOSPCT 1 09/10/2019   BASOPCT 0 09/10/2019    CMP:  Lab Results  Component Value Date   NA 133 (L) 09/15/2019   K 3.6 09/15/2019   CL 92 (L) 09/15/2019   CO2 32 09/15/2019   BUN 13 09/15/2019   CREATININE 0.92 09/15/2019   PROT 6.5 09/10/2019   ALBUMIN 2.7 (L) 09/10/2019   BILITOT 0.6 09/10/2019   ALKPHOS 53 09/10/2019   AST 34 09/10/2019   ALT 53 (H) 09/10/2019  .   Total Time in preparing paper work, data evaluation and todays exam - 35 minutes  Thomas Silva M.D on 09/15/2019 at 2:01 PM  [facility]   Office  [phone]

## 2019-09-16 LAB — GLUCOSE, CAPILLARY
Glucose-Capillary: 152 mg/dL — ABNORMAL HIGH (ref 70–99)
Glucose-Capillary: 158 mg/dL — ABNORMAL HIGH (ref 70–99)

## 2019-09-16 MED ORDER — TORSEMIDE 20 MG PO TABS
20.0000 mg | ORAL_TABLET | ORAL | Status: AC
  Administered 2019-09-16: 20 mg via ORAL
  Filled 2019-09-16: qty 1

## 2019-09-16 NOTE — Discharge Summary (Signed)
Thomas Silva, is a 54 y.o. male  DOB 1965-06-15  MRN [id number].  Admission date:  09/01/2019  Admitting Physician  Hillary Bow, DO  Discharge Date:  09/16/2019   Primary MD  Street, [name], MD   -No significant update on discharge summary on 09/15/2019, patient was seen and examined today, patient was not discharged as oxygen tank delivered yesterday was leaking, as well patient needed a concentrator, he remains on 2 L at rest, but with activity he requires up to 6 L for which he is requiring concentrator, which has been delivered today.  Recommendations for primary care physician for things to follow:  -Please check CBC, CMP during next visit.   Admission Diagnosis  Acute respiratory failure with hypoxia (HCC) [J96.01] Acute hypoxemic respiratory failure due to COVID-19 (HCC) [U07.1, J96.01] Pneumonia due to COVID-19 virus [U07.1, J12.82]   Discharge Diagnosis  Acute respiratory failure with hypoxia (HCC) [J96.01] Acute hypoxemic respiratory failure due to COVID-19 (HCC) [U07.1, J96.01] Pneumonia due to COVID-19 virus [U07.1, J12.82]   Active Problems:   Acute hypoxemic respiratory failure due to COVID-19 [facility])   Pneumonia due to COVID-19 virus   Community acquired pneumonia   HLD (hyperlipidemia)   Hypothyroidism   AKI (acute kidney injury) (HCC)      Past Medical History:  Diagnosis Date  . HLD (hyperlipidemia)     Past Surgical History:  Procedure Laterality Date  . VENTRAL HERNIA REPAIR  2017       History of present illness and  Hospital Course:     Kindly see H&P for history of present illness and admission details, please review complete Labs, Consult reports and Test reports for all details in brief  HPI  from the history and physical done on the day of admission 09/14/2019   HPI: Thomas Silva is a 54 y.o. male with medical history significant of HLD.  Pt with  several day h/o SOB and cough.  Presented to PCP's office today.  Tested COVID positive.  Pt has not had vaccine.   ED Course: Tm 102.7, CXR with bilateral infiltrates C/W COVID.  Desats to 87% on RA, currently satting well but on 4L via [place].    Hospital Course   Acute respiratory failure with hypoxia/Covid 19 pneumonia -Patient imaging significant for multifocal opacity, he is with severe COVID-19 for pneumonia, significant oxygen requirement, but this has significantly improved, he is currently requiring 2 L nasal cannula at rest, and 6 L with activity, he was encouraged to keep using incentive spirometry, and flutter valve at home, and to stay active. -Treated with 5 days of IV remdesivir, he was treated with total of 10 days of IV steroids, no indication for further steroids on discharge. -Was encouraged to use incentive spirometry and flutter valve, to take these devices with him and keep using them at home -Patient with history of hepatitis B, so he did not receive Actemra. -8/5 transfused 1 unit Covid Convalescent Plasma -8/8 patient's D-dimer trending up again will obtain CTA PE protocol  and bilateral lower extremity Doppler to rule out PE/DVT -8/9 CTA PE protocol negative for PE  -8/10 bilateral lower extremity Doppler negative, as well he had repeat venous Doppler of left lower extremity today for discharge giving some edema in left lower extremity, but this was negative for DVT as well. -As well he did require as needed diuresis with IV Lasix, no further diuresis indicated at time of discharge.  CAP -He was treated with IV Rocephin and azithromycin  Hyperlipidemia --Crestor 10 mg daily  Hypothyroidism -Synthroid 25 mcg daily  DM type II uncontrolled with complication -8/4 hemoglobin A1c= 8.5 -Resume home regimen on discharge  GERD -Continue with PPI  AKI -Resolved  There was a concern of 30 beats of V. tach 8/14 on telemetry monitor, I have discussed with Dr.  Mayford Knife who reviewed this telemetry strip, it does appear to be an artifact, and not true V. Tach  Discharge Condition:  stable   Follow UP   Follow-up Information    [facility] Follow up.   Why: for home oxygen Contact information: [address] [place] [address] [phone]                 Discharge Instructions  and  Discharge Medications    Discharge Instructions    Discharge instructions   Complete by: As directed    Follow with Primary MD Street, [name], MD in 10 days   Get CBC, CMP, 2 view Chest X ray checked  by Primary MD next visit.    Activity: As tolerated with Full fall precautions use walker/cane & assistance as needed   Disposition Home    Diet: Heart Healthy/low-salt/carb modified.  For Heart failure patients - Check your Weight same time everyday, if you gain over 2 pounds, or you develop in leg swelling, experience more shortness of breath or chest pain, call your Primary MD immediately. Follow Cardiac Low Salt Diet and 1.5 lit/day fluid restriction.   On your next visit with your primary care physician please Get Medicines reviewed and adjusted.   Please request your Prim.MD to go over all Hospital Tests and Procedure/Radiological results at the follow up, please get all Hospital records sent to your Prim MD by signing hospital release before you go home.   If you experience worsening of your admission symptoms, develop shortness of breath, life threatening emergency, suicidal or homicidal thoughts you must seek medical attention immediately by calling 911 or calling your MD immediately  if symptoms less severe.  You Must read complete instructions/literature along with all the possible adverse reactions/side effects for all the Medicines you take and that have been prescribed to you. Take any new Medicines after you have completely understood and accpet all the possible adverse reactions/side effects.   Do not  drive, operating heavy machinery, perform activities at heights, swimming or participation in water activities or provide baby sitting services if your were admitted for syncope or siezures until you have seen by Primary MD or a Neurologist and advised to do so again.  Do not drive when taking Pain medications.    Do not take more than prescribed Pain, Sleep and Anxiety Medications  Special Instructions: If you have smoked or chewed Tobacco  in the last 2 yrs please stop smoking, stop any regular Alcohol  and or any Recreational drug use.  Wear Seat belts while driving.   Please note  You were cared for by a hospitalist during your hospital stay. If you have  any questions about your discharge medications or the care you received while you were in the hospital after you are discharged, you can call the unit and asked to speak with the hospitalist on call if the hospitalist that took care of you is not available. Once you are discharged, your primary care physician will handle any further medical issues. Please note that NO REFILLS for any discharge medications will be authorized once you are discharged, as it is imperative that you return to your primary care physician (or establish a relationship with a primary care physician if you do not have one) for your aftercare needs so that they can reassess your need for medications and monitor your lab values.   Increase activity slowly   Complete by: As directed      Allergies as of 09/16/2019      Reactions   Tramadol Nausea And Vomiting   Other reaction(s): Other (See Comments) Unknown      Medication List    STOP taking these medications   Valtrex 1000 MG tablet Generic drug: valACYclovir     TAKE these medications   acetaminophen 325 MG tablet Commonly known as: TYLENOL Take 2 tablets (650 mg total) by mouth every 6 (six) hours as needed for mild pain or headache (fever >/= 101).   amitriptyline 25 MG tablet Commonly known as:  ELAVIL Take 50 mg by mouth at bedtime as needed for sleep.   aspirin EC 81 MG tablet Take 81 mg by mouth daily. Swallow whole. Notes to patient: Tomorrow, 09/17/19.   cetirizine 10 MG tablet Commonly known as: ZYRTEC Take 10 mg by mouth daily. Notes to patient: Tomorrow, 09/17/19.   dexlansoprazole 60 MG capsule Commonly known as: DEXILANT Take 60 mg by mouth daily. Notes to patient: Tomorrow, 09/17/19.   folic acid 800 MCG tablet Commonly known as: FOLVITE Take 800 mcg by mouth daily. Notes to patient: Tomorrow, 09/17/19.   gabapentin 300 MG capsule Commonly known as: NEURONTIN Take 300-600 mg by mouth See admin instructions. Take 1 capsule in the morning, and 2 capsules at night Notes to patient: Tonight, 09/16/19.  NOTE DIFFERING DOSES BETWEEN MORNING AND NIGHT.   Jardiance 25 MG Tabs tablet Generic drug: empagliflozin Take 25 mg by mouth every morning. Notes to patient: Tomorrow, 09/17/19.   levothyroxine 25 MCG tablet Commonly known as: SYNTHROID Take 25 mcg by mouth daily before breakfast. Notes to patient: Tomorrow, 09/17/19.   lisinopril 2.5 MG tablet Commonly known as: ZESTRIL Take 2.5 mg by mouth daily. Notes to patient: Tomorrow, 09/17/19.   metFORMIN 500 MG 24 hr tablet Commonly known as: GLUCOPHAGE-XR Take 1,000 mg by mouth 2 (two) times daily. Notes to patient: Tonight, 09/16/19.   QUNOL ULTRA COQ10 PO Take 2 capsules by mouth daily. Notes to patient: Tomorrow, 09/17/19.   rosuvastatin 10 MG tablet Commonly known as: CRESTOR Take 10 mg by mouth every evening. Notes to patient: Tonight, 09/17/19.   testosterone cypionate 200 MG/ML injection Commonly known as: DEPOTESTOSTERONE CYPIONATE Inject 200 mg into the muscle every 14 (fourteen) days. Notes to patient: Resume home schedule.   Tradjenta 5 MG Tabs tablet Generic drug: linagliptin Take 5 mg by mouth daily. Notes to patient: Tomorrow, 09/17/19.            Durable Medical Equipment  (From  admission, onward)         Start     Ordered   09/16/19 0724  For home use only DME oxygen  Once  Question Answer Comment  Length of Need 6 Months   Mode or (Route) Nasal cannula   Liters per Minute 6   Frequency Continuous (stationary and portable oxygen unit needed)   Oxygen conserving device Yes   Oxygen delivery system Gas      09/16/19 0724   09/15/19 1946  For home use only DME oxygen  Once       Question Answer Comment  Length of Need 6 Months   Mode or (Route) Nasal cannula   Liters per Minute 6   Frequency Continuous (stationary and portable oxygen unit needed)   Oxygen conserving device Yes   Oxygen delivery system Gas      09/15/19 1952   09/13/19 1257  For home use only DME oxygen  Once       Question Answer Comment  Length of Need 6 Months   Mode or (Route) Nasal cannula   Liters per Minute 2   Frequency Continuous (stationary and portable oxygen unit needed)   Oxygen conserving device Yes   Oxygen delivery system Gas      09/13/19 1256            Diet and Activity recommendation: See Discharge Instructions above   Consults obtained -  None   Major procedures and Radiology Reports - PLEASE review detailed and final reports for all details, in brief -     CT ANGIO CHEST PE W OR WO CONTRAST  Result Date: 09/07/2019 CLINICAL DATA:  COVID positive, shortness of breath, rule out PE EXAM: CT ANGIOGRAPHY CHEST WITH CONTRAST TECHNIQUE: Multidetector CT imaging of the chest was performed using the standard protocol during bolus administration of intravenous contrast. Multiplanar CT image reconstructions and MIPs were obtained to evaluate the vascular anatomy. CONTRAST:  65mL OMNIPAQUE IOHEXOL 350 MG/ML SOLN COMPARISON:  None. FINDINGS: Cardiovascular: Satisfactory opacification of the pulmonary arteries to the segmental level. No evidence of pulmonary embolism. Normal heart size. Left coronary artery calcifications. No pericardial effusion.  Mediastinum/Nodes: No enlarged mediastinal, hilar, or axillary lymph nodes. Thyroid gland, trachea, and esophagus demonstrate no significant findings. Lungs/Pleura: Very extensive bilateral, somewhat geographic heterogeneous and ground-glass airspace opacity throughout the lungs. No pleural effusion or pneumothorax. Upper Abdomen: No acute abnormality. Musculoskeletal: No chest wall abnormality. No acute or significant osseous findings. Review of the MIP images confirms the above findings. IMPRESSION: 1. Negative examination for pulmonary embolism. 2. Very extensive bilateral, somewhat geographic heterogeneous and ground-glass airspace opacity throughout the lungs, consistent with COVID-19 airspace disease. 3. Coronary artery disease. Electronically Signed   By: Lauralyn Primes M.D.   On: 09/07/2019 18:29   DG Chest Portable 1 View  Result Date: 09/01/2019 CLINICAL DATA:  54 year old male with positive COVID-19 EXAM: PORTABLE CHEST 1 VIEW COMPARISON:  Chest radiograph dated 05/07 FINDINGS: Bilateral streaky densities most consistent with infiltrate, likely viral or atypical in etiology and in keeping with COVID-19. Clinical correlation is recommended. No pleural effusion or pneumothorax. The cardiac silhouette is within limits. No acute osseous pathology. IMPRESSION: Bilateral streaky densities most consistent with COVID-19 infiltrate. Electronically Signed   By: Elgie Collard M.D.   On: 09/01/2019 18:12   VAS [place] LOWER EXTREMITY VENOUS (DVT)  Result Date: 09/15/2019  Lower Venous DVTStudy Indications: Edema, and covid +.  Comparison Study: 09/08/19 previous Performing Technologist: Blanch Media RVS  Examination Guidelines: A complete evaluation includes B-mode imaging, spectral Doppler, color Doppler, and power Doppler as needed of all accessible portions of each vessel. Bilateral testing is considered an integral  part of a complete examination. Limited examinations for reoccurring indications may be performed  as noted. The reflux portion of the exam is performed with the patient in reverse Trendelenburg.  +-----+---------------+---------+-----------+----------+--------------+ RIGHTCompressibilityPhasicitySpontaneityPropertiesThrombus Aging +-----+---------------+---------+-----------+----------+--------------+ CFV  Full           Yes      Yes                                 +-----+---------------+---------+-----------+----------+--------------+   +---------+---------------+---------+-----------+----------+--------------+ LEFT     CompressibilityPhasicitySpontaneityPropertiesThrombus Aging +---------+---------------+---------+-----------+----------+--------------+ CFV      Full           Yes      Yes                                 +---------+---------------+---------+-----------+----------+--------------+ SFJ      Full                                                        +---------+---------------+---------+-----------+----------+--------------+ FV Prox  Full                                                        +---------+---------------+---------+-----------+----------+--------------+ FV Mid   Full                                                        +---------+---------------+---------+-----------+----------+--------------+ FV DistalFull                                                        +---------+---------------+---------+-----------+----------+--------------+ PFV      Full                                                        +---------+---------------+---------+-----------+----------+--------------+ POP      Full           Yes      Yes                                 +---------+---------------+---------+-----------+----------+--------------+ PTV      Full                                                        +---------+---------------+---------+-----------+----------+--------------+ PERO     Full                                                         +---------+---------------+---------+-----------+----------+--------------+  Summary: RIGHT: - No evidence of common femoral vein obstruction.  LEFT: - There is no evidence of deep vein thrombosis in the lower extremity.  - No cystic structure found in the popliteal fossa.  *See table(s) above for measurements and observations. Electronically signed by Sherald Hess MD on 09/15/2019 at 3:43:19 PM.    Final    VAS [place] LOWER EXTREMITY VENOUS (DVT)  Result Date: 09/08/2019  Lower Venous DVTStudy Other Indications: Covid positive, D-dimer trending up evaluate for DVT. Comparison Study: no prior Performing Technologist: Blanch Media RVS  Examination Guidelines: A complete evaluation includes B-mode imaging, spectral Doppler, color Doppler, and power Doppler as needed of all accessible portions of each vessel. Bilateral testing is considered an integral part of a complete examination. Limited examinations for reoccurring indications may be performed as noted. The reflux portion of the exam is performed with the patient in reverse Trendelenburg.  +---------+---------------+---------+-----------+----------+--------------+ RIGHT    CompressibilityPhasicitySpontaneityPropertiesThrombus Aging +---------+---------------+---------+-----------+----------+--------------+ CFV      Full           Yes      Yes                                 +---------+---------------+---------+-----------+----------+--------------+ SFJ      Full                                                        +---------+---------------+---------+-----------+----------+--------------+ FV Prox  Full                                                        +---------+---------------+---------+-----------+----------+--------------+ FV Mid   Full                                                        +---------+---------------+---------+-----------+----------+--------------+ FV DistalFull                                                         +---------+---------------+---------+-----------+----------+--------------+ PFV      Full                                                        +---------+---------------+---------+-----------+----------+--------------+ POP      Full           Yes      Yes                                 +---------+---------------+---------+-----------+----------+--------------+ PTV      Full                                                        +---------+---------------+---------+-----------+----------+--------------+  PERO     Full                                                        +---------+---------------+---------+-----------+----------+--------------+   +---------+---------------+---------+-----------+----------+--------------+ LEFT     CompressibilityPhasicitySpontaneityPropertiesThrombus Aging +---------+---------------+---------+-----------+----------+--------------+ CFV      Full           Yes      Yes                                 +---------+---------------+---------+-----------+----------+--------------+ SFJ      Full                                                        +---------+---------------+---------+-----------+----------+--------------+ FV Prox  Full                                                        +---------+---------------+---------+-----------+----------+--------------+ FV Mid   Full                                                        +---------+---------------+---------+-----------+----------+--------------+ FV DistalFull                                                        +---------+---------------+---------+-----------+----------+--------------+ PFV      Full                                                        +---------+---------------+---------+-----------+----------+--------------+ POP      Full           Yes      Yes                                  +---------+---------------+---------+-----------+----------+--------------+ PTV      Full                                                        +---------+---------------+---------+-----------+----------+--------------+ PERO     Full                                                        +---------+---------------+---------+-----------+----------+--------------+  Summary: BILATERAL: - No evidence of deep vein thrombosis seen in the lower extremities, bilaterally. - No evidence of superficial venous thrombosis in the lower extremities, bilaterally. -   *See table(s) above for measurements and observations. Electronically signed by Fabienne Brunsharles Fields MD on 09/08/2019 at 5:13:44 PM.    Final     Micro Results   No results found for this or any previous visit (from the past 240 hour(s)).     Today   Subjective:   Erenest BlankVincent Swartzentruber today has no headache,no chest abdominal pain,no new weakness tingling or numbness, feels much better wants to go home today.  Objective:   Blood pressure 111/64, pulse 98, temperature 98.5 F (36.9 C), temperature source Oral, resp. rate 18, height 5\' 11"  (1.803 m), weight 108.9 kg, SpO2 92 %.   Intake/Output Summary (Last 24 hours) at 09/16/2019 1400 Last data filed at 09/16/2019 1300 Gross per 24 hour  Intake 480 ml  Output 2750 ml  Net -2270 ml    Exam Awake Alert, Oriented x 3, No new F.N deficits, Normal affect.  Symmetrical Chest wall movement, Good air movement bilaterally, CTAB RRR,No Gallops,Rubs or new Murmurs, No Parasternal Heave +ve B.Sounds, Abd Soft, Non tender,No rebound -guarding or rigidity. No Cyanosis, Clubbing, has mild edema in left lower extremity (venous Dopplers negative for DVT today), No new Rash or bruise  Data Review   CBC w Diff:  Lab Results  Component Value Date   WBC 12.7 (H) 09/15/2019   HGB 13.8 09/15/2019   HCT 42.7 09/15/2019   PLT 411 (H) 09/15/2019   LYMPHOPCT 4 09/10/2019   MONOPCT 6  09/10/2019   EOSPCT 1 09/10/2019   BASOPCT 0 09/10/2019    CMP:  Lab Results  Component Value Date   NA 133 (L) 09/15/2019   K 3.6 09/15/2019   CL 92 (L) 09/15/2019   CO2 32 09/15/2019   BUN 13 09/15/2019   CREATININE 0.92 09/15/2019   PROT 6.5 09/10/2019   ALBUMIN 2.7 (L) 09/10/2019   BILITOT 0.6 09/10/2019   ALKPHOS 53 09/10/2019   AST 34 09/10/2019   ALT 53 (H) 09/10/2019  .   Total Time in preparing paper work, data evaluation and todays exam - 35 minutes  Huey Bienenstockawood Norvell Caswell M.D on 09/16/2019 at 2:00 PM  [facility]   Office  [phone]

## 2019-09-16 NOTE — Progress Notes (Signed)
Nsg Discharge Note  Admit Date:  09/01/2019 Discharge date: 09/16/2019   Thomas Silva to be D/C'd Home per MD order.  AVS completed.  Patient/caregiver able to verbalize understanding.  Discharge Medication: Allergies as of 09/16/2019      Reactions   Tramadol Nausea And Vomiting   Other reaction(s): Other (See Comments) Unknown      Medication List    STOP taking these medications   Valtrex 1000 MG tablet Generic drug: valACYclovir     TAKE these medications   acetaminophen 325 MG tablet Commonly known as: TYLENOL Take 2 tablets (650 mg total) by mouth every 6 (six) hours as needed for mild pain or headache (fever >/= 101).   amitriptyline 25 MG tablet Commonly known as: ELAVIL Take 50 mg by mouth at bedtime as needed for sleep.   aspirin EC 81 MG tablet Take 81 mg by mouth daily. Swallow whole. Notes to patient: Tomorrow, 09/17/19.   cetirizine 10 MG tablet Commonly known as: ZYRTEC Take 10 mg by mouth daily. Notes to patient: Tomorrow, 09/17/19.   dexlansoprazole 60 MG capsule Commonly known as: DEXILANT Take 60 mg by mouth daily. Notes to patient: Tomorrow, 09/17/19.   folic acid 800 MCG tablet Commonly known as: FOLVITE Take 800 mcg by mouth daily. Notes to patient: Tomorrow, 09/17/19.   gabapentin 300 MG capsule Commonly known as: NEURONTIN Take 300-600 mg by mouth See admin instructions. Take 1 capsule in the morning, and 2 capsules at night Notes to patient: Tonight, 09/16/19.  NOTE DIFFERING DOSES BETWEEN MORNING AND NIGHT.   Jardiance 25 MG Tabs tablet Generic drug: empagliflozin Take 25 mg by mouth every morning. Notes to patient: Tomorrow, 09/17/19.   levothyroxine 25 MCG tablet Commonly known as: SYNTHROID Take 25 mcg by mouth daily before breakfast. Notes to patient: Tomorrow, 09/17/19.   lisinopril 2.5 MG tablet Commonly known as: ZESTRIL Take 2.5 mg by mouth daily. Notes to patient: Tomorrow, 09/17/19.   metFORMIN 500 MG 24 hr  tablet Commonly known as: GLUCOPHAGE-XR Take 1,000 mg by mouth 2 (two) times daily. Notes to patient: Tonight, 09/16/19.   QUNOL ULTRA COQ10 PO Take 2 capsules by mouth daily. Notes to patient: Tomorrow, 09/17/19.   rosuvastatin 10 MG tablet Commonly known as: CRESTOR Take 10 mg by mouth every evening. Notes to patient: Tonight, 09/17/19.   testosterone cypionate 200 MG/ML injection Commonly known as: DEPOTESTOSTERONE CYPIONATE Inject 200 mg into the muscle every 14 (fourteen) days. Notes to patient: Resume home schedule.   Tradjenta 5 MG Tabs tablet Generic drug: linagliptin Take 5 mg by mouth daily. Notes to patient: Tomorrow, 09/17/19.            Durable Medical Equipment  (From admission, onward)         Start     Ordered   09/16/19 0724  For home use only DME oxygen  Once       Question Answer Comment  Length of Need 6 Months   Mode or (Route) Nasal cannula   Liters per Minute 6   Frequency Continuous (stationary and portable oxygen unit needed)   Oxygen conserving device Yes   Oxygen delivery system Gas      09/16/19 0724   09/15/19 1946  For home use only DME oxygen  Once       Question Answer Comment  Length of Need 6 Months   Mode or (Route) Nasal cannula   Liters per Minute 6   Frequency Continuous (stationary and portable oxygen unit  needed)   Oxygen conserving device Yes   Oxygen delivery system Gas      09/15/19 1952   09/13/19 1257  For home use only DME oxygen  Once       Question Answer Comment  Length of Need 6 Months   Mode or (Route) Nasal cannula   Liters per Minute 2   Frequency Continuous (stationary and portable oxygen unit needed)   Oxygen conserving device Yes   Oxygen delivery system Gas      09/13/19 1256          Discharge Assessment: Vitals:   09/15/19 2213 09/16/19 0608  BP: (!) 108/53 111/64  Pulse: 70 98  Resp: 20 18  Temp: 98.9 F (37.2 C) 98.5 F (36.9 C)  SpO2: 94% 92%   Skin clean, dry and intact without  evidence of skin break down, no evidence of skin tears noted. IV catheter discontinued intact. Site without signs and symptoms of complications - no redness or edema noted at insertion site, patient denies c/o pain - only slight tenderness at site.  Dressing with slight pressure applied.  D/c Instructions-Education: Discharge instructions given to patient/family with verbalized understanding. D/c education completed with patient/family including follow up instructions, medication list, d/c activities limitations if indicated, with other d/c instructions as indicated by MD - patient able to verbalize understanding, all questions fully answered. Patient instructed to return to ED, call 911, or call MD for any changes in condition.  Patient escorted via WC, and D/C home via private auto. Staff assisted to load pateint's belongings and portable O2 into vehicle.  Luellen Pucker, RN 09/16/2019 3:35 PM

## 2019-09-16 NOTE — Plan of Care (Signed)
  Problem: Education: Goal: Knowledge of risk factors and measures for prevention of condition will improve 09/16/2019 1536 by Luellen Pucker, RN Outcome: Adequate for Discharge 09/16/2019 1158 by Luellen Pucker, RN Outcome: Adequate for Discharge   Problem: Coping: Goal: Psychosocial and spiritual needs will be supported 09/16/2019 1536 by Luellen Pucker, RN Outcome: Adequate for Discharge 09/16/2019 1158 by Luellen Pucker, RN Outcome: Adequate for Discharge   Problem: Respiratory: Goal: Will maintain a patent airway 09/16/2019 1536 by Luellen Pucker, RN Outcome: Adequate for Discharge 09/16/2019 1158 by Luellen Pucker, RN Outcome: Adequate for Discharge Goal: Complications related to the disease process, condition or treatment will be avoided or minimized 09/16/2019 1536 by Luellen Pucker, RN Outcome: Adequate for Discharge 09/16/2019 1158 by Luellen Pucker, RN Outcome: Adequate for Discharge   Problem: Education: Goal: Knowledge of General Education information will improve Description: Including pain rating scale, medication(s)/side effects and non-pharmacologic comfort measures 09/16/2019 1536 by Luellen Pucker, RN Outcome: Adequate for Discharge 09/16/2019 1158 by Luellen Pucker, RN Outcome: Adequate for Discharge   Problem: Activity: Goal: Risk for activity intolerance will decrease 09/16/2019 1536 by Luellen Pucker, RN Outcome: Adequate for Discharge 09/16/2019 1158 by Luellen Pucker, RN Outcome: Adequate for Discharge   Problem: Nutrition: Goal: Adequate nutrition will be maintained 09/16/2019 1536 by Luellen Pucker, RN Outcome: Adequate for Discharge 09/16/2019 1158 by Luellen Pucker, RN Outcome: Adequate for Discharge

## 2019-09-16 NOTE — Progress Notes (Signed)
SATURATION QUALIFICATIONS: (This note is used to comply with regulatory documentation for home oxygen)  Patient Saturations on Room Air at Rest = 88%  Patient Saturations on Room Air while Ambulating = 86%  Patient Saturations on 6 Liters of oxygen while Ambulating = 94%  Please briefly explain why patient needs home oxygen: covid infection

## 2019-09-16 NOTE — Plan of Care (Signed)
  Problem: Education: Goal: Knowledge of risk factors and measures for prevention of condition will improve Outcome: Adequate for Discharge   Problem: Coping: Goal: Psychosocial and spiritual needs will be supported Outcome: Adequate for Discharge   Problem: Respiratory: Goal: Will maintain a patent airway Outcome: Adequate for Discharge Goal: Complications related to the disease process, condition or treatment will be avoided or minimized Outcome: Adequate for Discharge   Problem: Education: Goal: Knowledge of General Education information will improve Description: Including pain rating scale, medication(s)/side effects and non-pharmacologic comfort measures Outcome: Adequate for Discharge   Problem: Activity: Goal: Risk for activity intolerance will decrease Outcome: Adequate for Discharge  Patient now has a tank that will deliver 6L continuous for trip home. Patient will call [employer] when leaving so they can meet him at home and change out O2 concentrator for 10L O2 concentrator.  Patient verbalizes he has no one to pick him up or assist him with transport home and driving self is his only option for discharge. Patient verbalizes understanding that tank will provide 1 hour of O2 at this flow rate.  Problem: Nutrition: Goal: Adequate nutrition will be maintained Outcome: Adequate for Discharge  Patient consumes 100% of meals and snacks.

## 2019-11-05 ENCOUNTER — Encounter: Payer: Self-pay | Admitting: Pulmonary Disease

## 2019-11-05 ENCOUNTER — Other Ambulatory Visit: Payer: Self-pay

## 2019-11-05 ENCOUNTER — Ambulatory Visit (INDEPENDENT_AMBULATORY_CARE_PROVIDER_SITE_OTHER): Payer: Commercial Managed Care - PPO | Admitting: Pulmonary Disease

## 2019-11-05 ENCOUNTER — Ambulatory Visit (INDEPENDENT_AMBULATORY_CARE_PROVIDER_SITE_OTHER): Payer: Commercial Managed Care - PPO

## 2019-11-05 VITALS — BP 136/78 | HR 76 | Temp 97.2°F | Ht 71.0 in | Wt 240.0 lb

## 2019-11-05 DIAGNOSIS — J9601 Acute respiratory failure with hypoxia: Secondary | ICD-10-CM | POA: Diagnosis not present

## 2019-11-05 DIAGNOSIS — U071 COVID-19: Secondary | ICD-10-CM

## 2019-11-05 DIAGNOSIS — J1282 Pneumonia due to coronavirus disease 2019: Secondary | ICD-10-CM

## 2019-11-05 NOTE — Patient Instructions (Signed)
Nice to meet you! I am glad you are doing better!  We will get a chest xray today to check on the lungs.  Come back in 3 months with Dr. Judeth Horn for a follow up visit and a 6 minute walk test the same day.

## 2019-11-05 NOTE — Progress Notes (Signed)
@Patient  ID: , male    DOB: [birth year], 54 y.o.   MRN: [id number]  Chief Complaint  Patient presents with   pulmonary consult    recent admission 09/01/2019--discharged on 6L. currently wearing 2L with exertion & at night. c/o sob with exertion, non prod cough and occ wheezing.     Referring provider: Street, 11/01/2019, *  HPI:   Thomas Silva is a 54 year old man with past medical history of diabetes and hyperlipidemia whom we are seeing in consultation at the request of 57 MD for evaluation of chronic hypoxemic respiratory failure following COVID-19 ARDS/pneumonia.  Notes from referring provider as well as extensive notes from prolonged hospitalization in August 2021 reviewed.  Patient was hospitalized for 17  days at [facility] for hypoxic respiratory due to ARDS from Covid pneumonia.  Chest pain on admission with bilateral interstitial opacities with more prominent left peripheral midlung field opacity on initial chest x-ray.  CTA chest was obtained given concern for PE which demonstrated diffuse right greater than left bilateral groundglass opacities with predilection for the periphery.  He was treated with standard therapies in the hospital including IV steroids, remdesivir as well as plasma.  He gradually improved.  He was discharged from the hospital on 6 L nasal cannula around-the-clock.  Over the last several weeks, his breathing is gradually improved with less shortness of breath.  Doing more with incentive spirometer.  Cough is present but getting better.  He routinely checks his oxygen.  He is gradually weaned his oxygen to the direction of his primary care provider.  He notes that he does not desaturate at rest and no longer desaturates with exertion.  Notes his oxygen gets around 90-93 when walking around.  He does carry his oxygen with him where he goes.  He continues to use oxygen when he sleeps around 2 to 2-2.5 L.  He has been awakened from sleep  spontaneously and realizing he is not wearing his oxygen has checked his O2 sat.  It was 93%.  He is slowly getting stronger.  He still has mild nonproductive cough.  Again this is improved since discharge.  He change his PPI recently with modest improvement in cough per his report.  He is eager to get back to work.  PMH: Diabetes, hyperlipidemia Surgical history: Reviewed, none Family history: No history of lung disease, diabetes Social history: Never smoker, lives in [facility], works as a [employer] / Pulmonary Flowsheets:   ACT:  No flowsheet data found.  MMRC: No flowsheet data found.  Epworth:  No flowsheet data found.  Tests:   FENO:  No results found for: NITRICOXIDE  PFT: No flowsheet data found.  WALK:  No flowsheet data found.  Imaging: Personally reviewed and as per EMR and discussion above  Lab Results: Personally reviewed CBC    Component Value Date/Time   WBC 12.7 (H) 09/15/2019 0413   RBC 4.71 09/15/2019 0413   HGB 13.8 09/15/2019 0413   HCT 42.7 09/15/2019 0413   PLT 411 (H) 09/15/2019 0413   MCV 90.7 09/15/2019 0413   MCH 29.3 09/15/2019 0413   MCHC 32.3 09/15/2019 0413   RDW 13.8 09/15/2019 0413   LYMPHSABS 0.4 (L) 09/10/2019 0123   MONOABS 0.6 09/10/2019 0123   EOSABS 0.1 09/10/2019 0123   BASOSABS 0.0 09/10/2019 0123    BMET    Component Value Date/Time   NA 133 (L) 09/15/2019 0413   K 3.6 09/15/2019  0413   CL 92 (L) 09/15/2019 0413   CO2 32 09/15/2019 0413   GLUCOSE 162 (H) 09/15/2019 0413   BUN 13 09/15/2019 0413   CREATININE 0.92 09/15/2019 0413   CALCIUM 8.3 (L) 09/15/2019 0413   GFRNONAA >60 09/15/2019 0413   GFRAA >60 09/15/2019 0413    BNP    Component Value Date/Time   BNP 27.7 09/12/2019 1315    ProBNP No results found for: PROBNP  Specialty Problems      Pulmonary Problems   Acute hypoxemic respiratory failure due to COVID-19 [facility])   Community acquired pneumonia   Pneumonia  due to COVID-19 virus      Allergies  Allergen Reactions   Tramadol Nausea And Vomiting    Other reaction(s): Other (See Comments) Unknown     There is no immunization history on file for this patient.  Past Medical History:  Diagnosis Date   HLD (hyperlipidemia)     Tobacco History: Social History   Tobacco Use  Smoking Status Never Smoker  Smokeless Tobacco Never Used   Counseling given: Not Answered   Continue to not smoke  Outpatient Encounter Medications as of 11/05/2019  Medication Sig   acetaminophen (TYLENOL) 325 MG tablet Take 2 tablets (650 mg total) by mouth every 6 (six) hours as needed for mild pain or headache (fever >/= 101).   amitriptyline (ELAVIL) 25 MG tablet Take 50 mg by mouth at bedtime as needed for sleep.   aspirin EC 81 MG tablet Take 81 mg by mouth daily. Swallow whole.   cetirizine (ZYRTEC) 10 MG tablet Take 10 mg by mouth daily.   Coenzyme Q10-Vitamin E (QUNOL ULTRA COQ10 PO) Take 2 capsules by mouth daily.   dexlansoprazole (DEXILANT) 60 MG capsule Take 60 mg by mouth daily.   folic acid (FOLVITE) 800 MCG tablet Take 800 mcg by mouth daily.   gabapentin (NEURONTIN) 300 MG capsule Take 300-600 mg by mouth See admin instructions. Take 1 capsule in the morning, and 2 capsules at night   JARDIANCE 25 MG TABS tablet Take 25 mg by mouth every morning.   levothyroxine (SYNTHROID) 25 MCG tablet Take 25 mcg by mouth daily before breakfast.   lisinopril (ZESTRIL) 2.5 MG tablet Take 2.5 mg by mouth daily.   metFORMIN (GLUCOPHAGE-XR) 500 MG 24 hr tablet Take 1,000 mg by mouth 2 (two) times daily.   rosuvastatin (CRESTOR) 10 MG tablet Take 10 mg by mouth every evening.   testosterone cypionate (DEPOTESTOSTERONE CYPIONATE) 200 MG/ML injection Inject 200 mg into the muscle every 14 (fourteen) days.   TRADJENTA 5 MG TABS tablet Take 5 mg by mouth daily.   No facility-administered encounter medications on file as of 11/05/2019.      Review of Systems  Review of Systems  No chest pain with exertion.  No orthopnea or PND.  Comprehensive review of systems otherwise negative.  Physical Exam  BP 136/78 (BP Location: Left Arm, Cuff Size: Normal)    Pulse 76    Temp (!) 97.2 F (36.2 C) (Temporal)    Ht 5\' 11"  (1.803 m)    Wt 240 lb (108.9 kg)    SpO2 98%    BMI 33.47 kg/m   Wt Readings from Last 5 Encounters:  11/05/19 240 lb (108.9 kg)  09/05/19 240 lb 1.3 oz (108.9 kg)    BMI Readings from Last 5 Encounters:  11/05/19 33.47 kg/m  09/05/19 33.48 kg/m     Physical Exam General: Well appearing, in NAD Eyes: EOMI,  no icterus Neck: No JVP, supple Respiratory: Clear aspiration bilaterally, no crackles or wheezes Cardiovascular: Regular rhythm, no murmurs Abdomen: Nondistended, bowel sounds present Images: Warm, no edema MSK: No synovitis, no joint effusions Neuro: Normal gait, no weakness Psych: Normal mood, full affect  Assessment & Plan:   Chronic Hypoxemic Respiratory Failure: Due to Covid 19 ARDS. Improving over time. Using 2L [place] when sleeps. Monitors O2 and gets to 92% with exertion. Notes he woke up one night after not putting on O2 and was 93%. Recommend he have O2 with him and continue to monitor O2 at home. Continue nocturnal use. Exam clear. Will obtain CXR today. Follow up in 3 months with and overnight oximetry at that time.   DOE: Improving. Due to above. Is increasing exertional capacity. Expect will continue to improve.   Cough: Dry. Likely related to GERD. Ongoing PPI per PCP. Happy to re-evaluate in the future if needed.    Work note provided to return to work next week.  Return in about 3 months (around 02/05/2020).   Karren Burly, MD 11/05/2019

## 2019-11-06 NOTE — Progress Notes (Signed)
Chest xray shows great improvement!

## 2019-12-10 ENCOUNTER — Telehealth: Payer: Self-pay | Admitting: Pulmonary Disease

## 2019-12-10 NOTE — Telephone Encounter (Signed)
ATC patient x1.  Left VM to return call.

## 2019-12-10 NOTE — Telephone Encounter (Signed)
Called and spoke with pt. Pt wants to know if it would be okay with him receiving a pna vaccine. Dr. Judeth Horn, please advise.

## 2019-12-10 NOTE — Telephone Encounter (Signed)
Patient is returning phone call. Patient phone number is [phone].

## 2019-12-11 NOTE — Telephone Encounter (Signed)
Hunsucker, Lesia Sago, MD  You 5 minutes ago (12:15 PM)  MH No problem - [id number].   Routing comment    Called and spoke with pt letting him know that Dr. Judeth Horn said it was okay for him to receive a pneumonia vaccine. Pt first stated that he would get it at work but then I stated to him that there are two different pneumonia vaccines. After stated that to him and when I was explaining to him the one Dr. Judeth Horn wanted him to get, he then said he would like to come to our office to get the vaccine.  Stated to pt that I would route this to [place] who works in injection room for her to see when she might be able to get him on the schedule. Stated that she would contact him and he verbalized understanding.  Misty Stanley, please advise.

## 2019-12-11 NOTE — Telephone Encounter (Signed)
Called and spoke with Patient. Patient scheduled 12/12/19, at 4pm for pneumonia vaccine, per Dr. Judeth Horn Patient needs pneumovax 23.

## 2019-12-12 ENCOUNTER — Other Ambulatory Visit: Payer: Self-pay

## 2019-12-12 ENCOUNTER — Ambulatory Visit (INDEPENDENT_AMBULATORY_CARE_PROVIDER_SITE_OTHER): Payer: Commercial Managed Care - PPO

## 2019-12-12 DIAGNOSIS — Z23 Encounter for immunization: Secondary | ICD-10-CM | POA: Diagnosis not present

## 2020-03-05 ENCOUNTER — Telehealth: Payer: Self-pay | Admitting: Pulmonary Disease

## 2020-03-05 NOTE — Telephone Encounter (Signed)
Called and spoke with pt and he is scheduled for 2/11 for his 6 min walk.  He stated that he was supposed to have this done in Jan but he just went back to work and he has not had time to do this.

## 2020-03-12 ENCOUNTER — Other Ambulatory Visit: Payer: Self-pay

## 2020-03-12 ENCOUNTER — Ambulatory Visit (INDEPENDENT_AMBULATORY_CARE_PROVIDER_SITE_OTHER): Payer: Commercial Managed Care - PPO

## 2020-03-12 DIAGNOSIS — J1282 Pneumonia due to coronavirus disease 2019: Secondary | ICD-10-CM

## 2020-03-12 DIAGNOSIS — U071 COVID-19: Secondary | ICD-10-CM

## 2020-03-12 NOTE — Progress Notes (Signed)
Six Minute Walk - 03/12/20 1632      Six Minute Walk   Medications taken before test (dose and time) Gabapentin 230pm, Synthroid 5am, Zyrtec 8am, Dexilant 8am, Jardiance 8am, Metformin 8am, Tradjenta 5mg  8am, Vit D 8am, and Folic acid 8am.    Supplemental oxygen during test? No    Lap distance in meters  34 meters    Laps Completed 13    Partial lap (in meters) 1 meters    Baseline BP (sitting) 118/82    Baseline Heartrate 96    Baseline Dyspnea (Borg Scale) 1    Baseline Fatigue (Borg Scale) 1    Baseline SPO2 96 %      End of Test Values    BP (sitting) 132/84    Heartrate 92    Dyspnea (Borg Scale) 1    Fatigue (Borg Scale) 1    SPO2 96 %      2 Minutes Post Walk Values   BP (sitting) 122/82    Heartrate 88    SPO2 98 %    Stopped or paused before six minutes? No      Interpretation   Distance completed 443 meters    Tech Comments: Patient tolerated well. Denies chest pain.

## 2020-04-05 ENCOUNTER — Telehealth: Payer: Self-pay | Admitting: Pulmonary Disease

## 2020-04-05 NOTE — Telephone Encounter (Signed)
Spoke with pt and advised that 6 minute walk results were faxed to Dr [name] via epic. Pt verbalized understanding. Nothing further needed.

## 2022-05-28 IMAGING — CT CT ANGIO CHEST
2 of 7 series · 18 of 46 positions shown · IV contrast (omnipaque)
Comparison: None.

CLINICAL DATA: COVID positive, shortness of breath, rule out PE

EXAM:
CT ANGIOGRAPHY CHEST WITH CONTRAST
TECHNIQUE: Multidetector CT imaging of the chest was performed using the
standard protocol during bolus administration of intravenous
contrast. Multiplanar CT image reconstructions and MIPs were
obtained to evaluate the vascular anatomy.
CONTRAST:  75mL OMNIPAQUE IOHEXOL 350 MG/ML SOLN

[Series 6: thins · axial · 0.97mm/px · z∈[+1093,+1387]mm · 15 of 330 slices shown]
[im 18/330  lung]
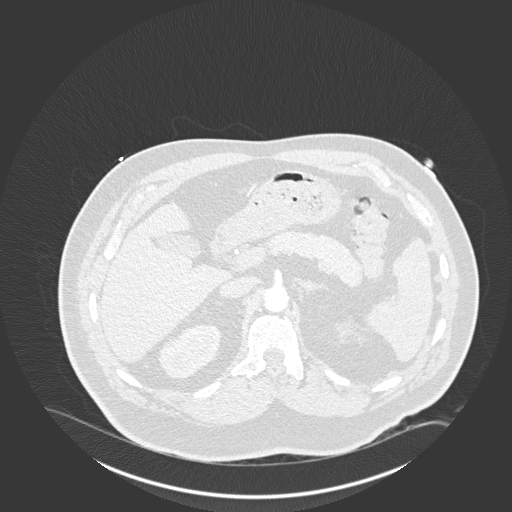
[im 35/330  soft-tissue]
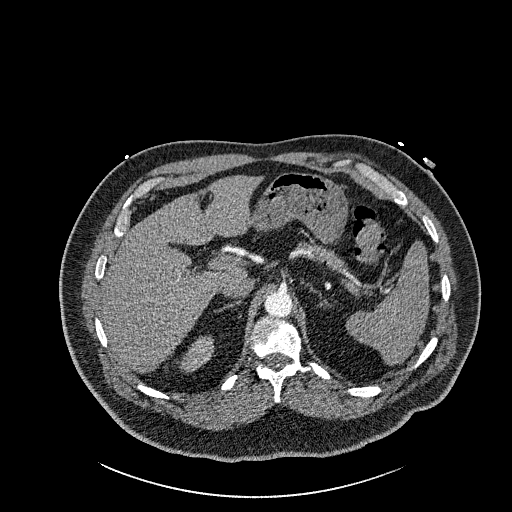
[im 70/330  lung]
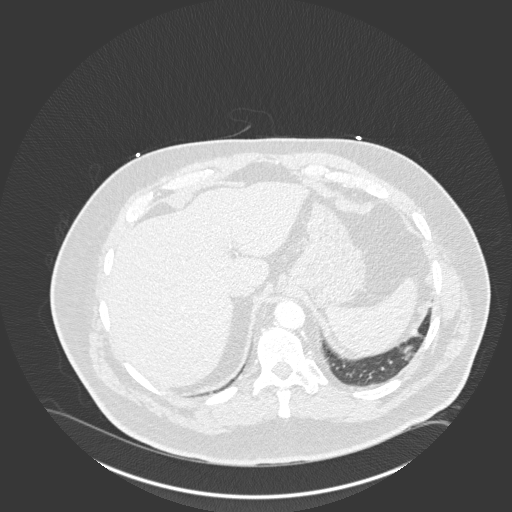
[im 87/330  soft-tissue]
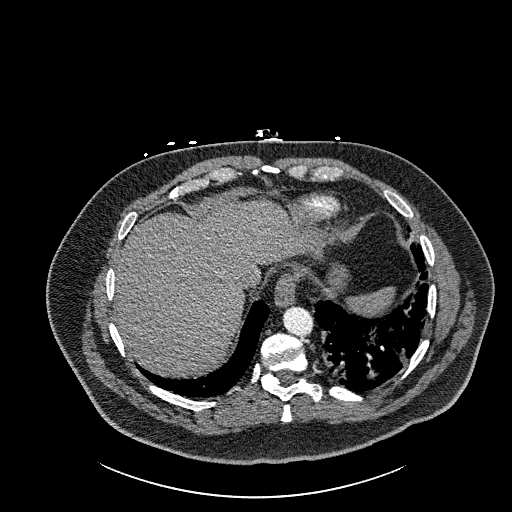
[im 104/330  lung]
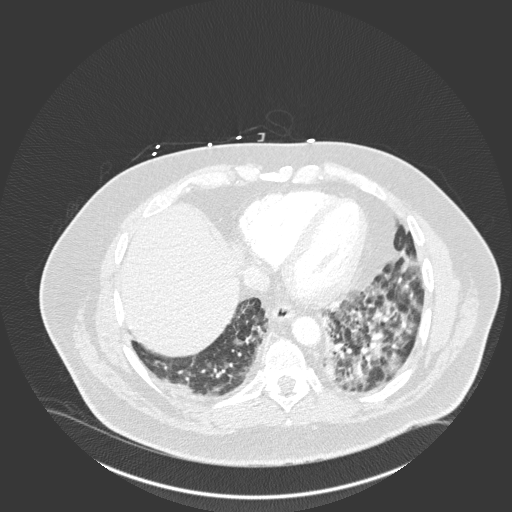
[im 122/330  soft-tissue]
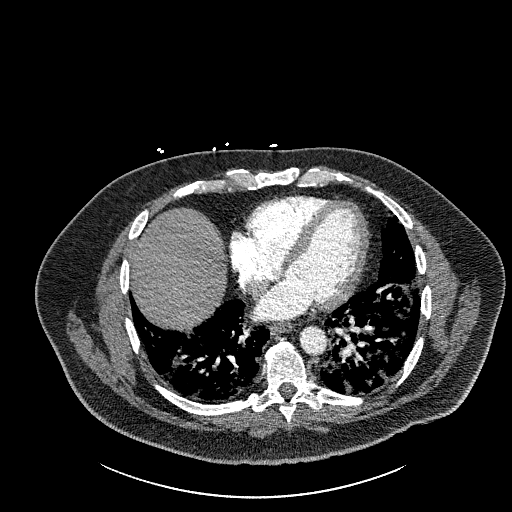
[im 139/330  lung]
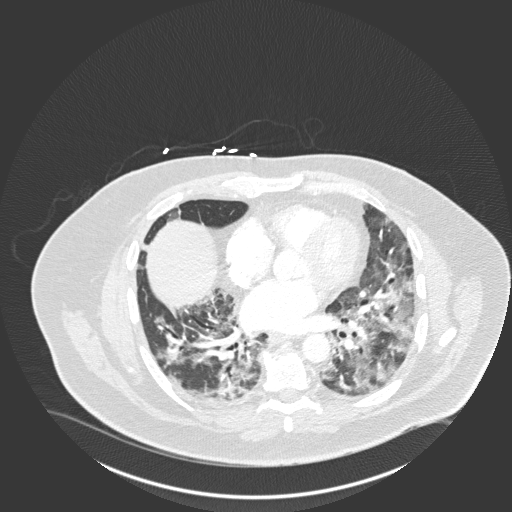
[im 174/330  soft-tissue]
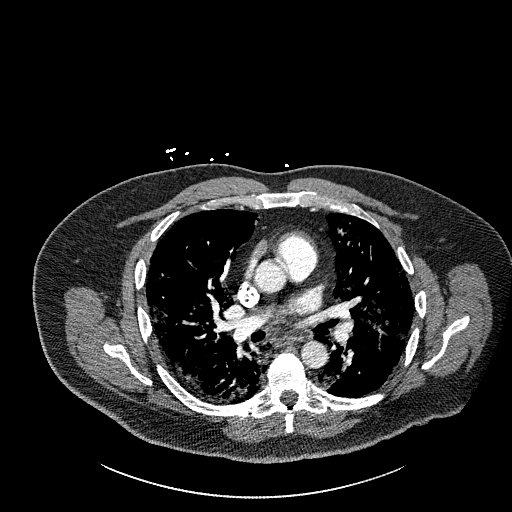
[im 191/330  lung]
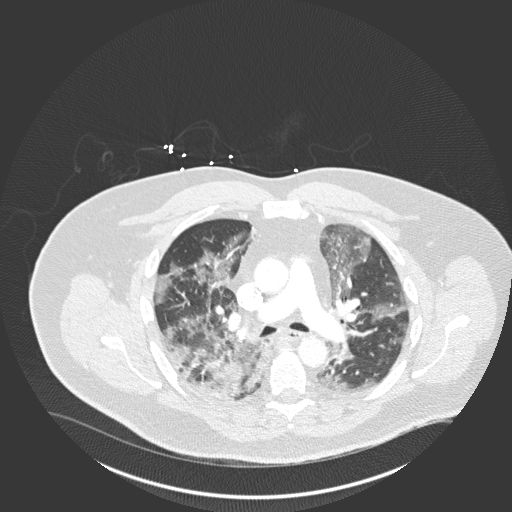
[im 208/330  soft-tissue]
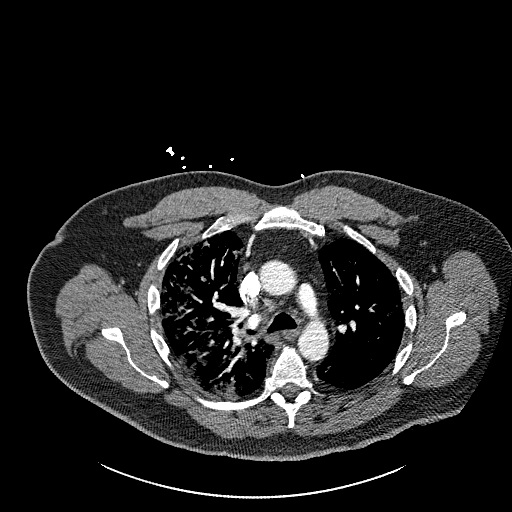
[im 226/330  lung]
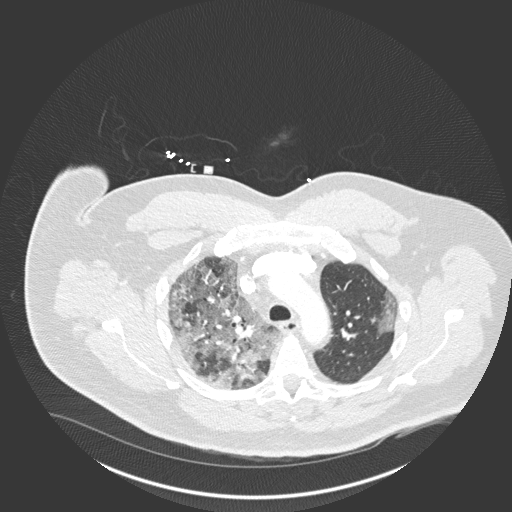
[im 243/330  soft-tissue]
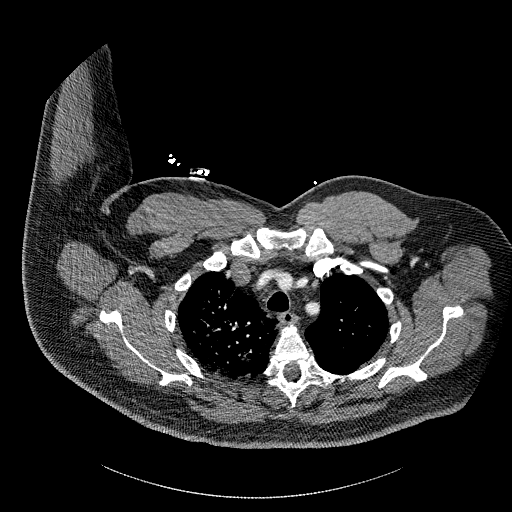
[im 278/330  lung]
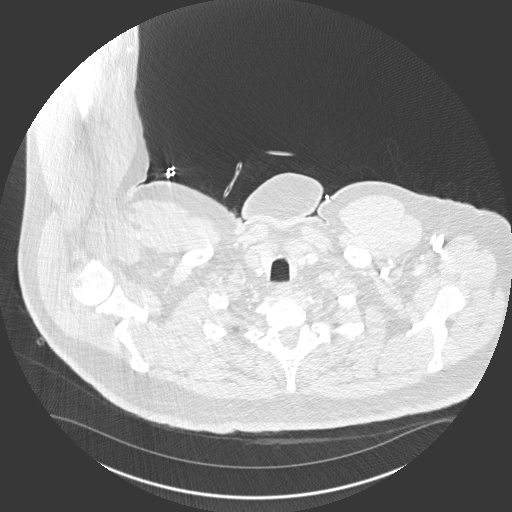
[im 295/330  soft-tissue]
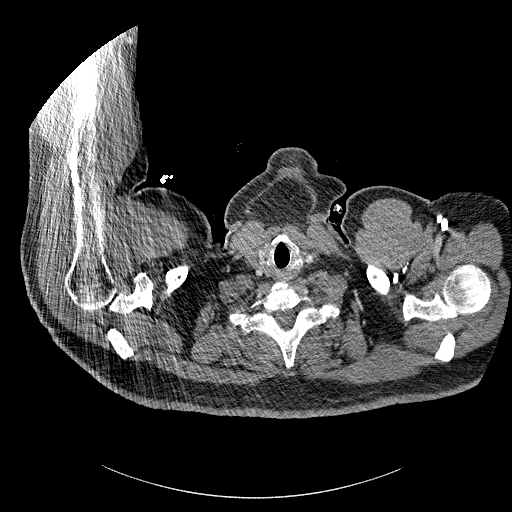
[im 312/330  lung]
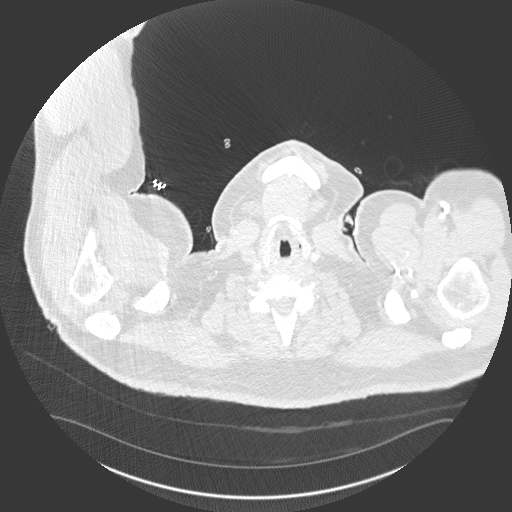

[Series 8: coronal mpr · coronal · 0.76mm/px · 3 of 176 slices shown]
[im 44/176  soft-tissue]
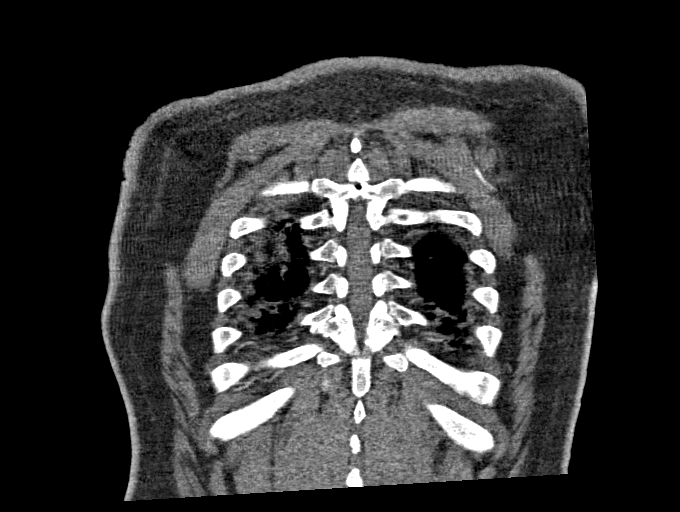
[im 88/176  soft-tissue]
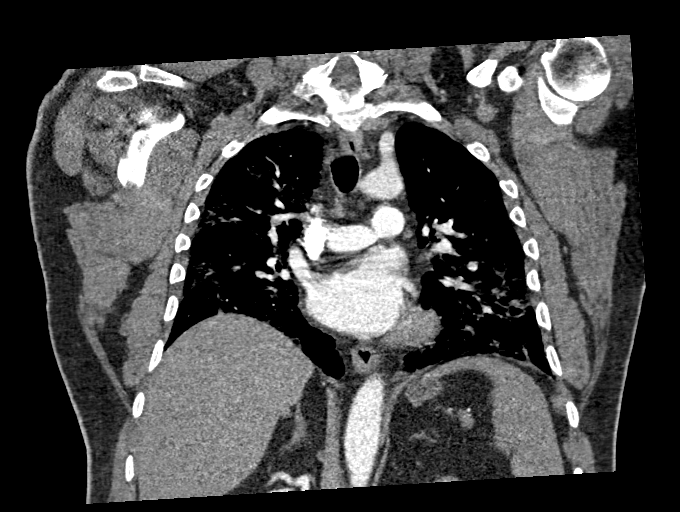
[im 132/176  soft-tissue]
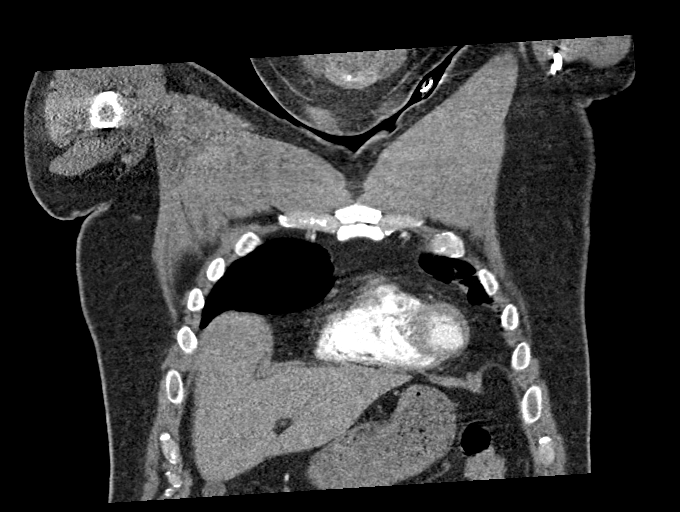

[18 of 46 positions shown; findings below may reference images not displayed]

FINDINGS: Cardiovascular: Satisfactory opacification of the pulmonary arteries
to the segmental level. No evidence of pulmonary embolism. Normal
heart size. Left coronary artery calcifications. No pericardial
effusion.

Mediastinum/Nodes: No enlarged mediastinal, hilar, or axillary lymph
nodes. Thyroid gland, trachea, and esophagus demonstrate no
significant findings.

Lungs/Pleura: Very extensive bilateral, somewhat geographic
heterogeneous and ground-glass airspace opacity throughout the
lungs. No pleural effusion or pneumothorax.

Upper Abdomen: No acute abnormality.

Musculoskeletal: No chest wall abnormality. No acute or significant
osseous findings.

Review of the MIP images confirms the above findings.
IMPRESSION: 1. Negative examination for pulmonary embolism.
2. Very extensive bilateral, somewhat geographic heterogeneous and
ground-glass airspace opacity throughout the lungs, consistent with
[id number] airspace disease.
3. Coronary artery disease.

## 2022-07-26 IMAGING — DX DG CHEST 2V
2 series · 2 of 2 positions shown · non-contrast
Comparison: 09/07/2019

CLINICAL DATA: Status post ZSEVW-XU pneumonia with persistent
hypoxemia

EXAM:
CHEST - 2 VIEW

[chest pa]
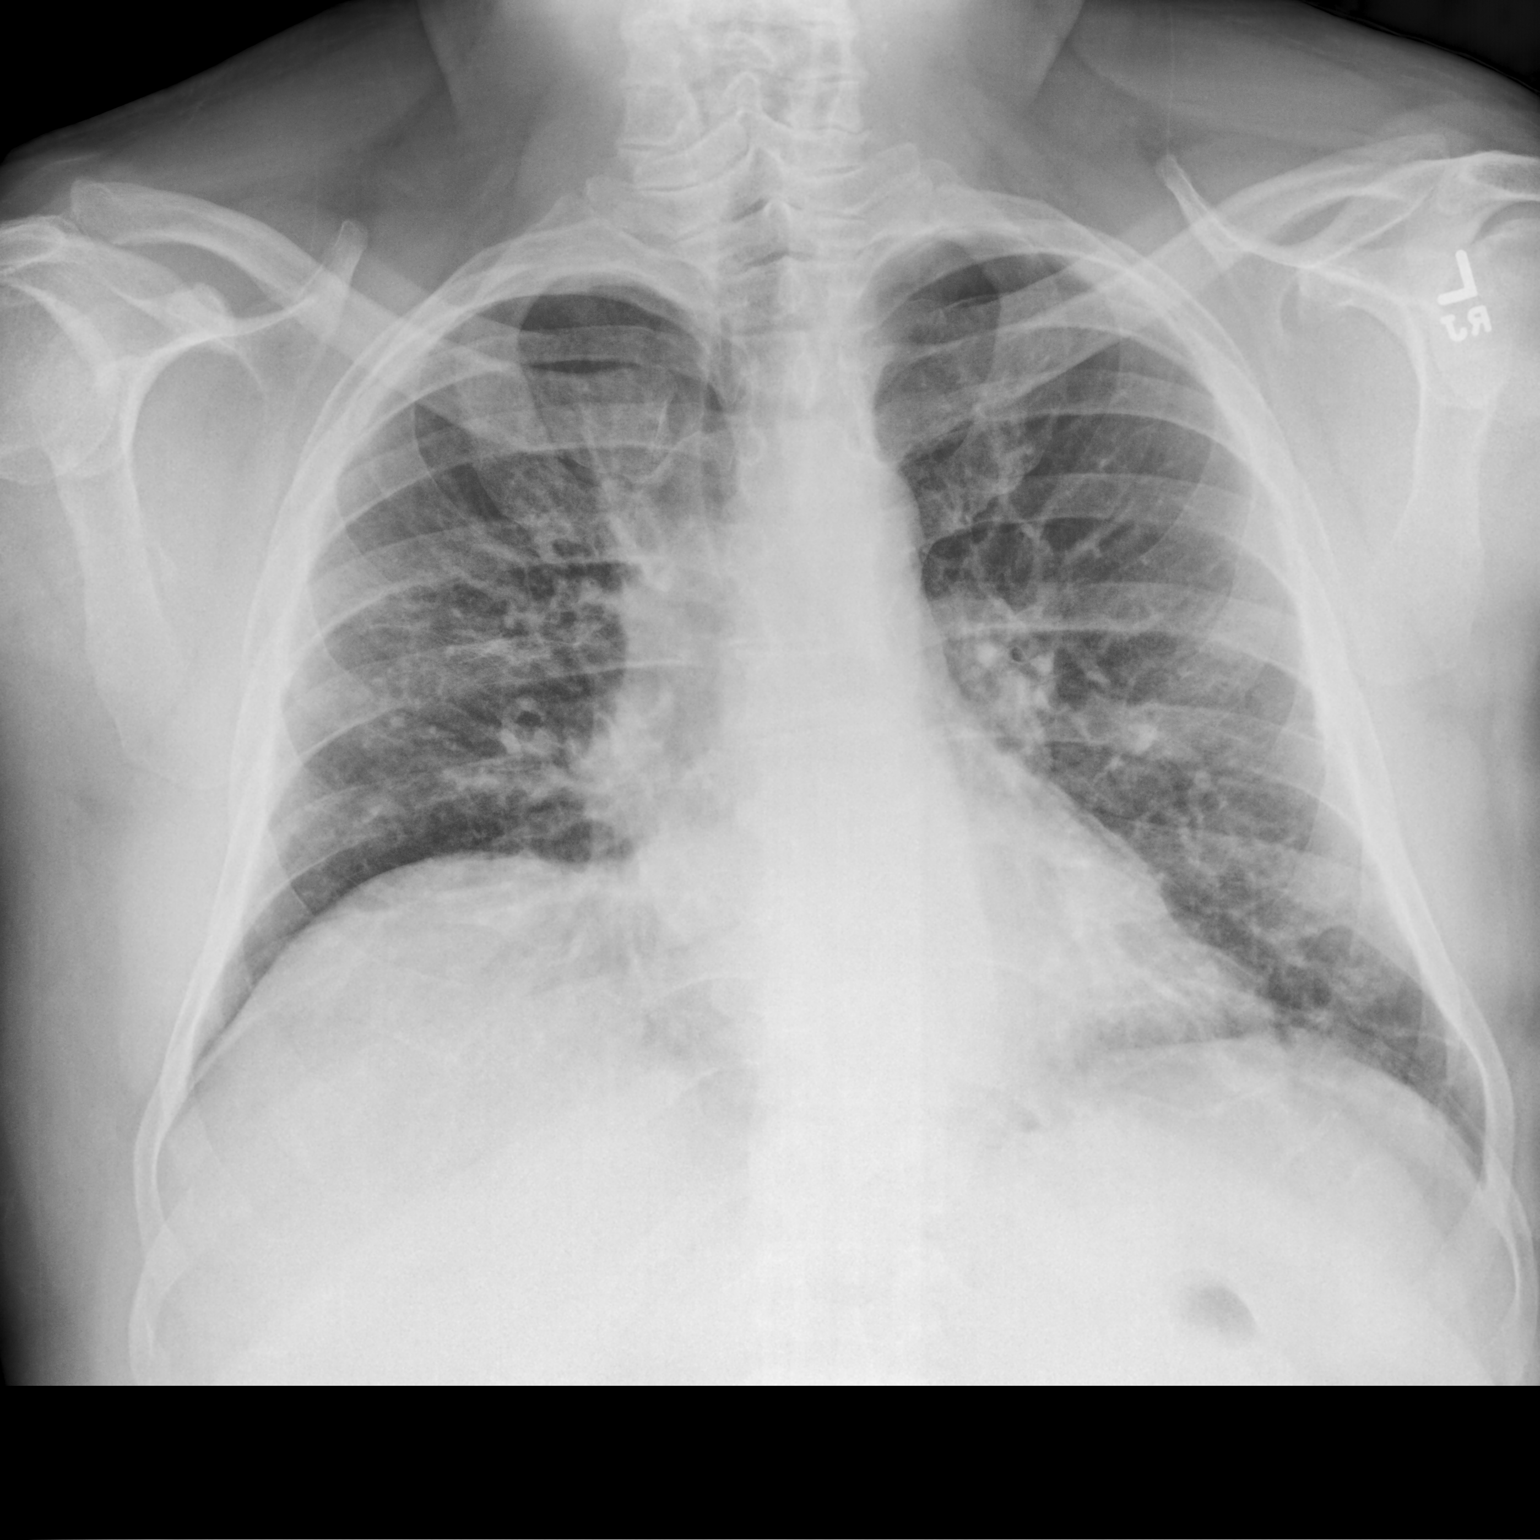

[chest lat]
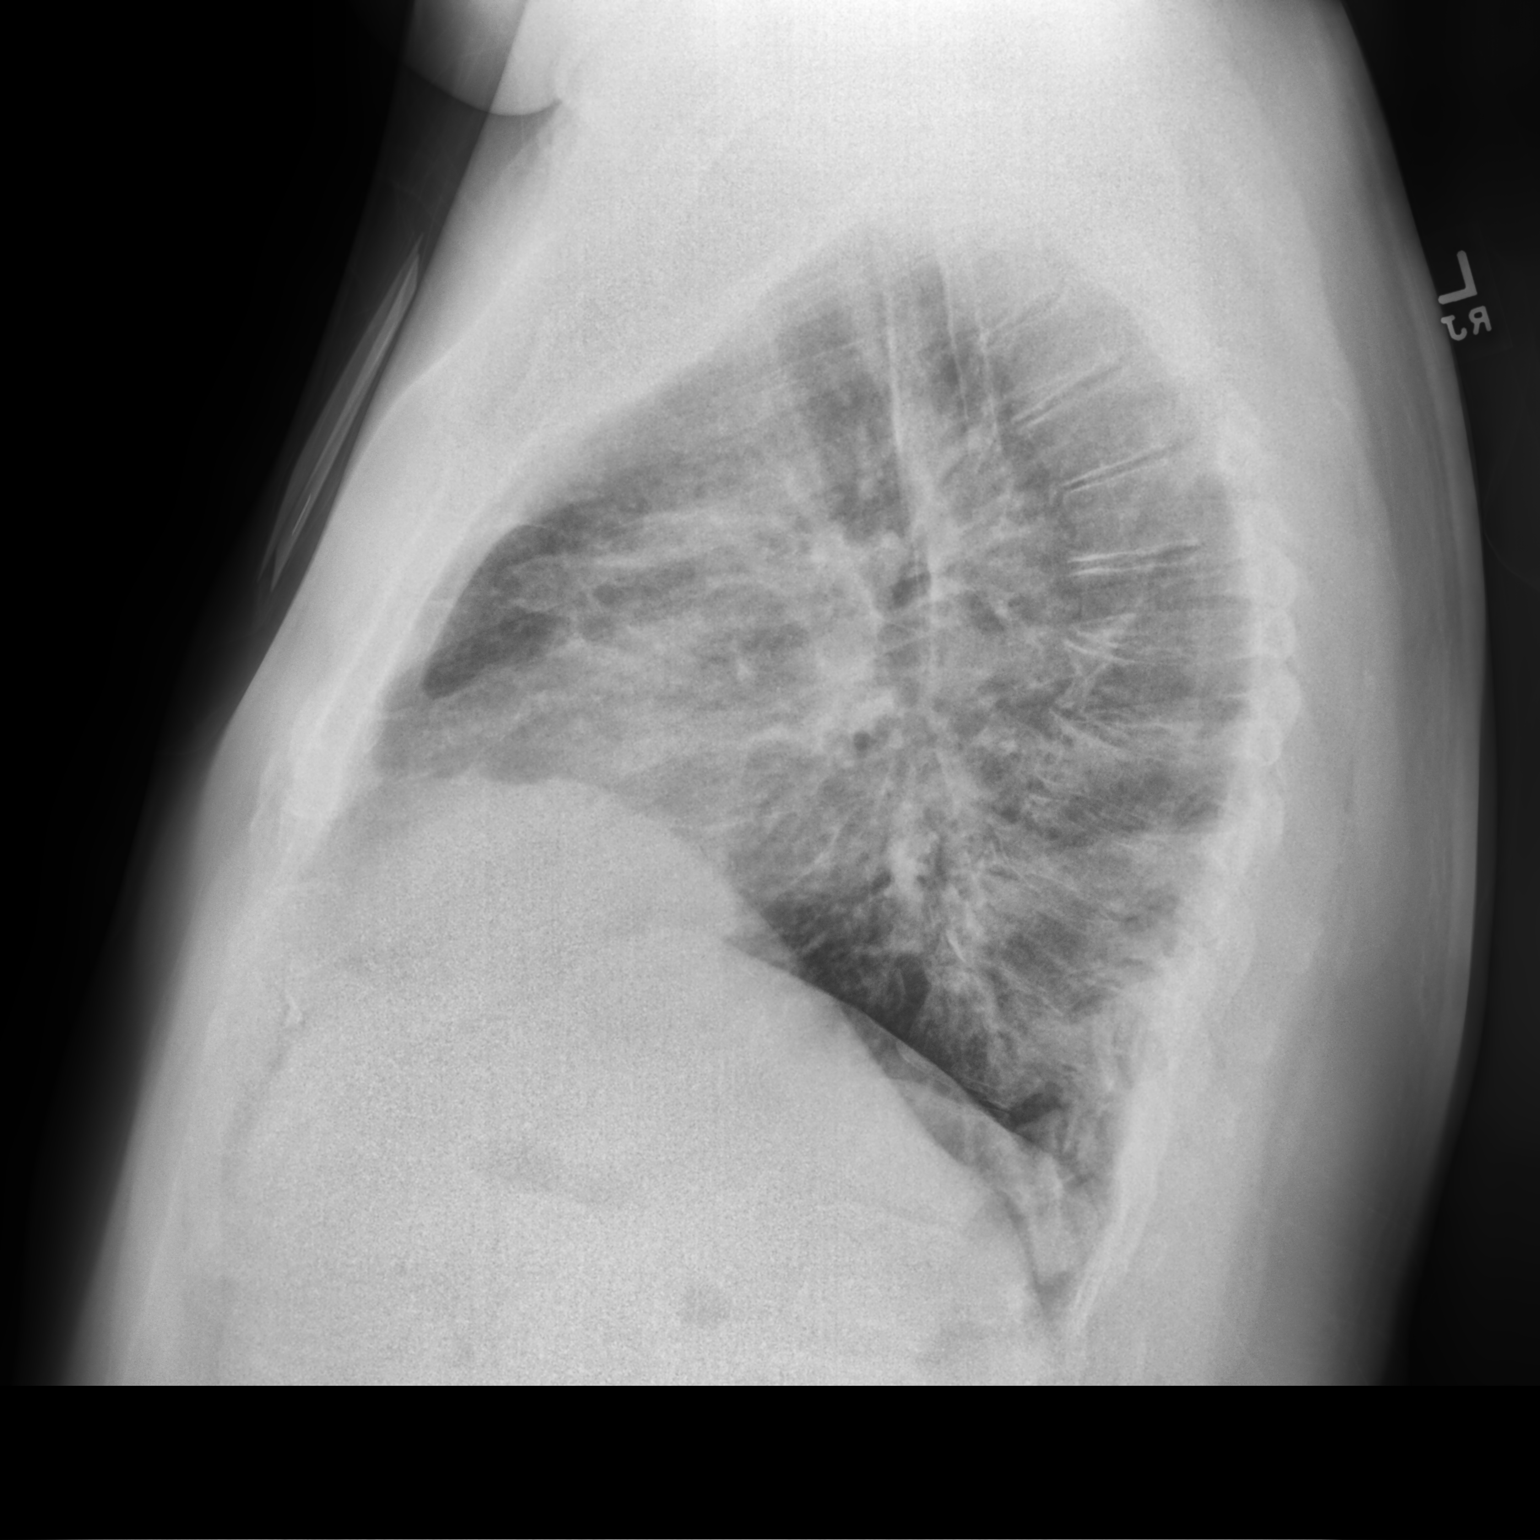

[2 of 2 positions shown; findings below may reference images not displayed]

FINDINGS: Cardiac shadow is stable. Lungs are well aerated bilaterally. Patchy
interstitial densities are noted consistent with the given clinical
history sequelae from the prior infiltrates. Significant improved
aeration is noted however. No sizable effusion is seen. No bony
abnormality is noted.
IMPRESSION: Patchy interstitial changes consistent with sequelae from ZSEVW-XU
pneumonia. The overall appearance however is significantly improved
from the prior exam.

## 2023-04-24 ENCOUNTER — Other Ambulatory Visit: Payer: Self-pay

## 2023-04-24 ENCOUNTER — Observation Stay (HOSPITAL_COMMUNITY)

## 2023-04-24 ENCOUNTER — Encounter (HOSPITAL_COMMUNITY): Payer: Self-pay

## 2023-04-24 ENCOUNTER — Emergency Department (HOSPITAL_COMMUNITY)

## 2023-04-24 ENCOUNTER — Inpatient Hospital Stay (HOSPITAL_COMMUNITY)
Admission: EM | Admit: 2023-04-24 | Discharge: 2023-04-26 | DRG: 312 | Disposition: A | Discharge status: Home / Self Care | Attending: Infectious Diseases | Admitting: Infectious Diseases

## 2023-04-24 DIAGNOSIS — I444 Left anterior fascicular block: Secondary | ICD-10-CM | POA: Diagnosis present

## 2023-04-24 DIAGNOSIS — J189 Pneumonia, unspecified organism: Secondary | ICD-10-CM | POA: Diagnosis present

## 2023-04-24 DIAGNOSIS — E039 Hypothyroidism, unspecified: Secondary | ICD-10-CM | POA: Diagnosis not present

## 2023-04-24 DIAGNOSIS — Z1152 Encounter for screening for COVID-19: Secondary | ICD-10-CM

## 2023-04-24 DIAGNOSIS — I10 Essential (primary) hypertension: Secondary | ICD-10-CM | POA: Diagnosis present

## 2023-04-24 DIAGNOSIS — E1349 Other specified diabetes mellitus with other diabetic neurological complication: Secondary | ICD-10-CM | POA: Diagnosis not present

## 2023-04-24 DIAGNOSIS — R0902 Hypoxemia: Secondary | ICD-10-CM | POA: Diagnosis present

## 2023-04-24 DIAGNOSIS — G4700 Insomnia, unspecified: Secondary | ICD-10-CM | POA: Diagnosis not present

## 2023-04-24 DIAGNOSIS — R509 Fever, unspecified: Secondary | ICD-10-CM | POA: Diagnosis not present

## 2023-04-24 DIAGNOSIS — Z7982 Long term (current) use of aspirin: Secondary | ICD-10-CM

## 2023-04-24 DIAGNOSIS — E1165 Type 2 diabetes mellitus with hyperglycemia: Secondary | ICD-10-CM | POA: Diagnosis present

## 2023-04-24 DIAGNOSIS — E669 Obesity, unspecified: Secondary | ICD-10-CM | POA: Diagnosis present

## 2023-04-24 DIAGNOSIS — Y92410 Unspecified street and highway as the place of occurrence of the external cause: Secondary | ICD-10-CM

## 2023-04-24 DIAGNOSIS — R55 Syncope and collapse: Principal | ICD-10-CM

## 2023-04-24 DIAGNOSIS — Z6834 Body mass index (BMI) 34.0-34.9, adult: Secondary | ICD-10-CM

## 2023-04-24 DIAGNOSIS — Z880 Allergy status to penicillin: Secondary | ICD-10-CM

## 2023-04-24 DIAGNOSIS — Z79890 Hormone replacement therapy (postmenopausal): Secondary | ICD-10-CM

## 2023-04-24 DIAGNOSIS — R000 Tachycardia, unspecified: Secondary | ICD-10-CM

## 2023-04-24 DIAGNOSIS — R61 Generalized hyperhidrosis: Secondary | ICD-10-CM | POA: Diagnosis present

## 2023-04-24 DIAGNOSIS — A419 Sepsis, unspecified organism: Principal | ICD-10-CM

## 2023-04-24 DIAGNOSIS — E785 Hyperlipidemia, unspecified: Secondary | ICD-10-CM | POA: Diagnosis present

## 2023-04-24 DIAGNOSIS — Z885 Allergy status to narcotic agent status: Secondary | ICD-10-CM

## 2023-04-24 DIAGNOSIS — K219 Gastro-esophageal reflux disease without esophagitis: Secondary | ICD-10-CM | POA: Diagnosis present

## 2023-04-24 DIAGNOSIS — Z79899 Other long term (current) drug therapy: Secondary | ICD-10-CM

## 2023-04-24 DIAGNOSIS — E66811 Obesity, class 1: Secondary | ICD-10-CM | POA: Diagnosis present

## 2023-04-24 DIAGNOSIS — Z7984 Long term (current) use of oral hypoglycemic drugs: Secondary | ICD-10-CM

## 2023-04-24 DIAGNOSIS — G479 Sleep disorder, unspecified: Secondary | ICD-10-CM

## 2023-04-24 DIAGNOSIS — I441 Atrioventricular block, second degree: Secondary | ICD-10-CM | POA: Diagnosis present

## 2023-04-24 DIAGNOSIS — E119 Type 2 diabetes mellitus without complications: Secondary | ICD-10-CM

## 2023-04-24 DIAGNOSIS — E1140 Type 2 diabetes mellitus with diabetic neuropathy, unspecified: Secondary | ICD-10-CM | POA: Diagnosis present

## 2023-04-24 LAB — PROTIME-INR
INR: 1 (ref 0.8–1.2)
Prothrombin Time: 13.9 s (ref 11.4–15.2)

## 2023-04-24 LAB — APTT: aPTT: 30 s (ref 24–36)

## 2023-04-24 LAB — CBC WITH DIFFERENTIAL/PLATELET
Abs Immature Granulocytes: 0.04 10*3/uL (ref 0.00–0.07)
Basophils Absolute: 0 10*3/uL (ref 0.0–0.1)
Basophils Relative: 0 %
Eosinophils Absolute: 0 10*3/uL (ref 0.0–0.5)
Eosinophils Relative: 0 %
HCT: 50.9 % (ref 39.0–52.0)
Hemoglobin: 16.3 g/dL (ref 13.0–17.0)
Immature Granulocytes: 0 %
Lymphocytes Relative: 3 %
Lymphs Abs: 0.3 10*3/uL — ABNORMAL LOW (ref 0.7–4.0)
MCH: 28.1 pg (ref 26.0–34.0)
MCHC: 32 g/dL (ref 30.0–36.0)
MCV: 87.8 fL (ref 80.0–100.0)
Monocytes Absolute: 0.5 10*3/uL (ref 0.1–1.0)
Monocytes Relative: 5 %
Neutro Abs: 8.7 10*3/uL — ABNORMAL HIGH (ref 1.7–7.7)
Neutrophils Relative %: 92 %
Platelets: 209 10*3/uL (ref 150–400)
RBC: 5.8 MIL/uL (ref 4.22–5.81)
RDW: 16 % — ABNORMAL HIGH (ref 11.5–15.5)
WBC: 9.6 10*3/uL (ref 4.0–10.5)
nRBC: 0 % (ref 0.0–0.2)

## 2023-04-24 LAB — COMPREHENSIVE METABOLIC PANEL
ALT: 28 U/L (ref 0–44)
AST: 44 U/L — ABNORMAL HIGH (ref 15–41)
Albumin: 3.4 g/dL — ABNORMAL LOW (ref 3.5–5.0)
Alkaline Phosphatase: 55 U/L (ref 38–126)
Anion gap: 10 (ref 5–15)
BUN: 13 mg/dL (ref 6–20)
CO2: 21 mmol/L — ABNORMAL LOW (ref 22–32)
Calcium: 8.3 mg/dL — ABNORMAL LOW (ref 8.9–10.3)
Chloride: 105 mmol/L (ref 98–111)
Creatinine, Ser: 1.01 mg/dL (ref 0.61–1.24)
GFR, Estimated: >60 mL/min (ref >60)
Glucose, Bld: 167 mg/dL — ABNORMAL HIGH (ref 70–99)
Potassium: 4 mmol/L (ref 3.5–5.1)
Sodium: 136 mmol/L (ref 135–145)
Total Bilirubin: 1.6 mg/dL — ABNORMAL HIGH (ref 0.0–1.2)
Total Protein: 6.5 g/dL (ref 6.5–8.1)

## 2023-04-24 LAB — URINALYSIS, W/ REFLEX TO CULTURE (INFECTION SUSPECTED)
Bacteria, UA: NONE SEEN
Bilirubin Urine: NEGATIVE
Glucose, UA: >=500 mg/dL — AB
Hgb urine dipstick: NEGATIVE
Ketones, ur: 20 mg/dL — AB
Leukocytes,Ua: NEGATIVE
Nitrite: NEGATIVE
Protein, ur: 100 mg/dL — AB
Specific Gravity, Urine: 1.037 — ABNORMAL HIGH (ref 1.005–1.030)
pH: 5 (ref 5.0–8.0)

## 2023-04-24 LAB — RESP PANEL BY RT-PCR (RSV, FLU A&B, COVID)  RVPGX2
Influenza A by PCR: NEGATIVE
Influenza B by PCR: NEGATIVE
Resp Syncytial Virus by PCR: NEGATIVE
SARS Coronavirus 2 by RT PCR: NEGATIVE

## 2023-04-24 LAB — HIV ANTIBODY (ROUTINE TESTING W REFLEX): HIV Screen 4th Generation wRfx: NONREACTIVE

## 2023-04-24 LAB — MAGNESIUM: Magnesium: 1.6 mg/dL — ABNORMAL LOW (ref 1.7–2.4)

## 2023-04-24 LAB — HEMOGLOBIN A1C
Hgb A1c MFr Bld: 7.1 % — ABNORMAL HIGH (ref 4.8–5.6)
Mean Plasma Glucose: 157.07 mg/dL

## 2023-04-24 LAB — D-DIMER, QUANTITATIVE: D-Dimer, Quant: 0.91 ug{FEU}/mL — ABNORMAL HIGH (ref 0.00–0.50)

## 2023-04-24 LAB — I-STAT CG4 LACTIC ACID, ED
Lactic Acid, Venous: 1.4 mmol/L (ref 0.5–1.9)
Lactic Acid, Venous: 1.2 mmol/L (ref 0.5–1.9)

## 2023-04-24 LAB — TSH: TSH: 1.054 u[IU]/mL (ref 0.350–4.500)

## 2023-04-24 MED ORDER — ZOLPIDEM TARTRATE 5 MG PO TABS
5.0000 mg | ORAL_TABLET | Freq: Every evening | ORAL | Status: DC | PRN
  Administered 2023-04-24 – 2023-04-25 (×2): 5 mg via ORAL
  Filled 2023-04-24 (×2): qty 1

## 2023-04-24 MED ORDER — MAGNESIUM SULFATE 2 GM/50ML IV SOLN
2.0000 g | Freq: Once | INTRAVENOUS | Status: AC
  Administered 2023-04-24: 2 g via INTRAVENOUS
  Filled 2023-04-24: qty 50

## 2023-04-24 MED ORDER — RIVAROXABAN 10 MG PO TABS
10.0000 mg | ORAL_TABLET | Freq: Every day | ORAL | Status: DC
  Administered 2023-04-24 – 2023-04-26 (×3): 10 mg via ORAL
  Filled 2023-04-24 (×3): qty 1

## 2023-04-24 MED ORDER — LACTATED RINGERS IV SOLN: INTRAVENOUS | Status: DC

## 2023-04-24 MED ORDER — ACETAMINOPHEN 500 MG PO TABS
1000.0000 mg | ORAL_TABLET | Freq: Once | ORAL | Status: AC
  Administered 2023-04-24: 1000 mg via ORAL
  Filled 2023-04-24: qty 2

## 2023-04-24 MED ORDER — LACTATED RINGERS IV BOLUS (SEPSIS)
1000.0000 mL | Freq: Once | INTRAVENOUS | Status: AC
  Administered 2023-04-24: 1000 mL via INTRAVENOUS

## 2023-04-24 MED ORDER — IOHEXOL 350 MG/ML SOLN
75.0000 mL | Freq: Once | INTRAVENOUS | Status: AC | PRN
  Administered 2023-04-24: 75 mL via INTRAVENOUS

## 2023-04-24 MED ORDER — GABAPENTIN 300 MG PO CAPS
300.0000 mg | ORAL_CAPSULE | Freq: Three times a day (TID) | ORAL | Status: DC
  Administered 2023-04-24 – 2023-04-26 (×5): 300 mg via ORAL
  Filled 2023-04-24 (×5): qty 1

## 2023-04-24 MED ORDER — LACTATED RINGERS IV BOLUS
1000.0000 mL | Freq: Once | INTRAVENOUS | Status: AC
  Administered 2023-04-24: 1000 mL via INTRAVENOUS

## 2023-04-24 MED ORDER — LEVOTHYROXINE SODIUM 25 MCG PO TABS
25.0000 ug | ORAL_TABLET | Freq: Every day | ORAL | Status: DC
  Administered 2023-04-25 – 2023-04-26 (×2): 25 ug via ORAL
  Filled 2023-04-24 (×2): qty 1

## 2023-04-24 MED ORDER — ACETAMINOPHEN 325 MG PO TABS
650.0000 mg | ORAL_TABLET | Freq: Four times a day (QID) | ORAL | Status: DC | PRN
Start: 2023-04-24 — End: 2023-04-26
  Administered 2023-04-24: 650 mg via ORAL
  Filled 2023-04-24: qty 2

## 2023-04-24 MED ORDER — SODIUM CHLORIDE 0.9 % IV SOLN
1.0000 g | Freq: Once | INTRAVENOUS | Status: AC
  Administered 2023-04-24: 1 g via INTRAVENOUS
  Filled 2023-04-24: qty 10

## 2023-04-24 MED ORDER — ROSUVASTATIN CALCIUM 5 MG PO TABS
10.0000 mg | ORAL_TABLET | Freq: Every evening | ORAL | Status: DC
  Administered 2023-04-24 – 2023-04-25 (×2): 10 mg via ORAL
  Filled 2023-04-24 (×2): qty 2
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
## 2023-04-24 NOTE — Sepsis Progress Note (Signed)
 Sepsis protocol monitored by eLink ?
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
## 2023-04-25 ENCOUNTER — Observation Stay (HOSPITAL_COMMUNITY)

## 2023-04-25 DIAGNOSIS — Z7982 Long term (current) use of aspirin: Secondary | ICD-10-CM | POA: Diagnosis not present

## 2023-04-25 DIAGNOSIS — E66811 Obesity, class 1: Secondary | ICD-10-CM | POA: Diagnosis present

## 2023-04-25 DIAGNOSIS — K219 Gastro-esophageal reflux disease without esophagitis: Secondary | ICD-10-CM | POA: Diagnosis present

## 2023-04-25 DIAGNOSIS — Z880 Allergy status to penicillin: Secondary | ICD-10-CM | POA: Diagnosis not present

## 2023-04-25 DIAGNOSIS — Z79899 Other long term (current) drug therapy: Secondary | ICD-10-CM | POA: Diagnosis not present

## 2023-04-25 DIAGNOSIS — E785 Hyperlipidemia, unspecified: Secondary | ICD-10-CM | POA: Diagnosis present

## 2023-04-25 DIAGNOSIS — E1140 Type 2 diabetes mellitus with diabetic neuropathy, unspecified: Secondary | ICD-10-CM | POA: Diagnosis present

## 2023-04-25 DIAGNOSIS — R61 Generalized hyperhidrosis: Secondary | ICD-10-CM | POA: Diagnosis present

## 2023-04-25 DIAGNOSIS — E1165 Type 2 diabetes mellitus with hyperglycemia: Secondary | ICD-10-CM | POA: Diagnosis present

## 2023-04-25 DIAGNOSIS — Z6834 Body mass index (BMI) 34.0-34.9, adult: Secondary | ICD-10-CM | POA: Diagnosis not present

## 2023-04-25 DIAGNOSIS — Z1152 Encounter for screening for COVID-19: Secondary | ICD-10-CM | POA: Diagnosis not present

## 2023-04-25 DIAGNOSIS — J189 Pneumonia, unspecified organism: Secondary | ICD-10-CM | POA: Diagnosis present

## 2023-04-25 DIAGNOSIS — E039 Hypothyroidism, unspecified: Secondary | ICD-10-CM | POA: Diagnosis present

## 2023-04-25 DIAGNOSIS — Z7984 Long term (current) use of oral hypoglycemic drugs: Secondary | ICD-10-CM | POA: Diagnosis not present

## 2023-04-25 DIAGNOSIS — G4700 Insomnia, unspecified: Secondary | ICD-10-CM | POA: Diagnosis present

## 2023-04-25 DIAGNOSIS — R0902 Hypoxemia: Secondary | ICD-10-CM | POA: Diagnosis present

## 2023-04-25 DIAGNOSIS — Y92410 Unspecified street and highway as the place of occurrence of the external cause: Secondary | ICD-10-CM | POA: Diagnosis not present

## 2023-04-25 DIAGNOSIS — I441 Atrioventricular block, second degree: Secondary | ICD-10-CM | POA: Diagnosis present

## 2023-04-25 DIAGNOSIS — I444 Left anterior fascicular block: Secondary | ICD-10-CM | POA: Diagnosis present

## 2023-04-25 DIAGNOSIS — R55 Syncope and collapse: Secondary | ICD-10-CM | POA: Diagnosis present

## 2023-04-25 DIAGNOSIS — I10 Essential (primary) hypertension: Secondary | ICD-10-CM | POA: Diagnosis present

## 2023-04-25 DIAGNOSIS — E669 Obesity, unspecified: Secondary | ICD-10-CM | POA: Diagnosis present

## 2023-04-25 DIAGNOSIS — Z79890 Hormone replacement therapy (postmenopausal): Secondary | ICD-10-CM | POA: Diagnosis not present

## 2023-04-25 LAB — BASIC METABOLIC PANEL
Anion gap: 11 (ref 5–15)
BUN: 10 mg/dL (ref 6–20)
CO2: 22 mmol/L (ref 22–32)
Calcium: 8.2 mg/dL — ABNORMAL LOW (ref 8.9–10.3)
Chloride: 105 mmol/L (ref 98–111)
Creatinine, Ser: 0.91 mg/dL (ref 0.61–1.24)
GFR, Estimated: >60 mL/min (ref >60)
Glucose, Bld: 97 mg/dL (ref 70–99)
Potassium: 3.6 mmol/L (ref 3.5–5.1)
Sodium: 138 mmol/L (ref 135–145)

## 2023-04-25 LAB — RESPIRATORY PANEL BY PCR

## 2023-04-25 LAB — CBC
HCT: 45.8 % (ref 39.0–52.0)
Hemoglobin: 14.6 g/dL (ref 13.0–17.0)
MCH: 28.2 pg (ref 26.0–34.0)
MCHC: 31.9 g/dL (ref 30.0–36.0)
MCV: 88.6 fL (ref 80.0–100.0)
Platelets: 188 10*3/uL (ref 150–400)
RBC: 5.17 MIL/uL (ref 4.22–5.81)
RDW: 16.3 % — ABNORMAL HIGH (ref 11.5–15.5)
WBC: 11.8 10*3/uL — ABNORMAL HIGH (ref 4.0–10.5)
nRBC: 0 % (ref 0.0–0.2)

## 2023-04-25 LAB — PROCALCITONIN: Procalcitonin: 8.54 ng/mL

## 2023-04-25 LAB — GLUCOSE, CAPILLARY: Glucose-Capillary: 156 mg/dL — ABNORMAL HIGH (ref 70–99)

## 2023-04-25 LAB — ECHOCARDIOGRAM COMPLETE
Area-P 1/2: 3.99 cm2
S' Lateral: 2.9 cm

## 2023-04-25 MED ORDER — GUAIFENESIN 100 MG/5ML PO LIQD
5.0000 mL | ORAL | Status: DC | PRN
  Administered 2023-04-25 – 2023-04-26 (×2): 5 mL via ORAL
  Filled 2023-04-25 (×2): qty 15

## 2023-04-25 MED ORDER — KETOROLAC TROMETHAMINE 15 MG/ML IJ SOLN: 15.0000 mg | Freq: Once | INTRAMUSCULAR | Status: DC

## 2023-04-25 MED ORDER — IBUPROFEN 200 MG PO TABS
600.0000 mg | ORAL_TABLET | Freq: Once | ORAL | Status: AC
Start: 2023-04-25 — End: 2023-04-25
  Administered 2023-04-25: 600 mg via ORAL
  Filled 2023-04-25: qty 3

## 2023-04-25 MED ORDER — FAMOTIDINE 20 MG PO TABS
40.0000 mg | ORAL_TABLET | Freq: Every day | ORAL | Status: DC | PRN
Start: 2023-04-25 — End: 2023-04-26
  Administered 2023-04-25: 40 mg via ORAL
  Filled 2023-04-25: qty 2

## 2023-04-25 MED ORDER — SODIUM CHLORIDE 0.9 % IV SOLN
2.0000 g | INTRAVENOUS | Status: DC
  Administered 2023-04-25 – 2023-04-26 (×2): 2 g via INTRAVENOUS
  Filled 2023-04-25 (×2): qty 20

## 2023-04-25 NOTE — Progress Notes (Signed)
 Patient is back after CT.

## 2023-04-25 NOTE — Progress Notes (Signed)
 Subjective Endorses mild frontal band HA and diaphoresis. Denies SOB, cough, CP.  Physical exam Blood pressure (!) 142/79, pulse 100, temperature 98.6 F (37 C), temperature source Oral, resp. rate 19, height 5\' 11"  (1.803 m), weight 111.1 kg, SpO2 94%.  Constitutional: obese, lying in bed, in no acute distress Cardiovascular: regular rate and rhythm, no m/r/g Pulmonary/Chest: normal work of breathing on room air, lungs clear to auscultation bilaterally Abdominal: soft, non-tender, non-distended MSK: normal bulk and tone Neurological: alert & oriented x 3, no focal deficit Skin: warm and dry Psych: normal mood and behavior   Weight change:    Intake/Output Summary (Last 24 hours) at 04/25/2023 1039 Last data filed at 04/25/2023 0413 Gross per 24 hour  Intake 50 ml  Output --  Net 50 ml   Net IO Since Admission: 915.8 mL [04/25/23 1039]  Labs, images, and other studies    Latest Ref Rng & Units 04/25/2023    6:32 AM 04/24/2023    9:16 AM 09/15/2019    4:13 AM  CBC  WBC 4.0 - 10.5 K/uL 11.8  9.6  12.7   Hemoglobin 13.0 - 17.0 g/dL 40.9  81.1  91.4   Hematocrit 39.0 - 52.0 % 45.8  50.9  42.7   Platelets 150 - 400 K/uL 188  209  411        Latest Ref Rng & Units 04/25/2023    6:32 AM 04/24/2023    9:16 AM 09/15/2019    4:13 AM  BMP  Glucose 70 - 99 mg/dL 97  782  956   BUN 6 - 20 mg/dL 10  13  13    Creatinine 0.61 - 1.24 mg/dL 2.13  0.86  5.78   Sodium 135 - 145 mmol/L 138  136  133   Potassium 3.5 - 5.1 mmol/L 3.6  4.0  3.6   Chloride 98 - 111 mmol/L 105  105  92   CO2 22 - 32 mmol/L 22  21  32   Calcium 8.9 - 10.3 mg/dL 8.2  8.3  8.3      Assessment and plan Hospital day 2  Thomas Silva is a 58 y.o.male with history of T2DM, HLD, hypothyroidism, obesity who is admitted for syncope evaluation.   Principal Problem:   Syncope Active Problems:   Hypothyroidism   DM (diabetes mellitus), secondary, with neurologic complications (HCC)    Insomnia  Syncope No further episodes since admission. Etiology remains unclear. The only positive finding is suspected CAP for which we will begin treating per below. Telemetry reassuring. Will proceed with TTE and CTH to further investigate.    Suspected CAP CTA shows patchy RLL infiltrate consistent with early PNA. Mild leukocytosis today. Procalcitonin is 8.54. Fever has resolved today but he did get Tylenol for HA last night. Tachycardia has improved. Will begin CAP coverage with IV Rocephin 2 g q 24 for 5 days. Will hold off on Zithromax for now as 2/3 atypicals ruled out with respiratory panel. -Trend CBC  Hypothyroid TSH wnl. Continue home Synthroid.   T2DM A1c is 7.1. Only on oral agents at home. BMP looks good. Will check CBG q 12 and consider resuming home Jardiance.   Sleep troubles Takes 5 mg of Ambien nightly for sleep, will continue this while hospitalized.   ASCVD risk reduction Takes rosuvastatin 10 mg and aspirin for primary prevention.  Continue statin, hold aspirin.  Holding antihypertensives  for now.  HA Frontal, band-like. No aura. Will get CTH per above.  Tylenol prn  Carmina Miller, DO 04/25/2023, 10:39 AM  Pager: [phone] After 5pm or weekend: [phone]

## 2023-04-25 NOTE — Progress Notes (Signed)
  Echocardiogram 2D Echocardiogram has been performed.  Thomas Silva 04/25/2023, 5:29 PM

## 2023-04-25 NOTE — Plan of Care (Signed)
  Problem: Health Behavior/Discharge Planning: Goal: Ability to manage health-related needs will improve Outcome: Progressing   Problem: Activity: Goal: Risk for activity intolerance will decrease Outcome: Progressing   Problem: Nutrition: Goal: Adequate nutrition will be maintained Outcome: Progressing   Problem: Coping: Goal: Level of anxiety will decrease Outcome: Progressing   Problem: Elimination: Goal: Will not experience complications related to bowel motility Outcome: Progressing Goal: Will not experience complications related to urinary retention Outcome: Progressing   Problem: Pain Managment: Goal: General experience of comfort will improve and/or be controlled Outcome: Progressing   Problem: Safety: Goal: Ability to remain free from injury will improve Outcome: Progressing   Problem: Skin Integrity: Goal: Risk for impaired skin integrity will decrease Outcome: Progressing

## 2023-04-25 NOTE — Progress Notes (Signed)
 Patient was taken for the CT via bed.

## 2023-04-25 NOTE — Progress Notes (Signed)
 Patient's SPO2      on RA is 97% Ambulatory SPO2 on RA is 95%

## 2023-04-26 ENCOUNTER — Other Ambulatory Visit (HOSPITAL_COMMUNITY): Payer: Self-pay

## 2023-04-26 ENCOUNTER — Other Ambulatory Visit: Payer: Self-pay | Admitting: Student

## 2023-04-26 ENCOUNTER — Inpatient Hospital Stay (HOSPITAL_COMMUNITY)
Admit: 2023-04-26 | Discharge: 2023-04-26 | Disposition: A | Discharge status: Home / Self Care | Attending: Student | Admitting: Student

## 2023-04-26 DIAGNOSIS — J189 Pneumonia, unspecified organism: Secondary | ICD-10-CM | POA: Diagnosis not present

## 2023-04-26 DIAGNOSIS — I441 Atrioventricular block, second degree: Secondary | ICD-10-CM

## 2023-04-26 DIAGNOSIS — R55 Syncope and collapse: Secondary | ICD-10-CM | POA: Diagnosis not present

## 2023-04-26 LAB — CBC
HCT: 46.7 % (ref 39.0–52.0)
Hemoglobin: 14.8 g/dL (ref 13.0–17.0)
MCH: 27.8 pg (ref 26.0–34.0)
MCHC: 31.7 g/dL (ref 30.0–36.0)
MCV: 87.8 fL (ref 80.0–100.0)
Platelets: 208 10*3/uL (ref 150–400)
RBC: 5.32 MIL/uL (ref 4.22–5.81)
RDW: 16.1 % — ABNORMAL HIGH (ref 11.5–15.5)
WBC: 6.9 10*3/uL (ref 4.0–10.5)
nRBC: 0 % (ref 0.0–0.2)

## 2023-04-26 LAB — BASIC METABOLIC PANEL WITH GFR
Anion gap: 11 (ref 5–15)
BUN: 11 mg/dL (ref 6–20)
CO2: 22 mmol/L (ref 22–32)
Calcium: 8.4 mg/dL — ABNORMAL LOW (ref 8.9–10.3)
Chloride: 104 mmol/L (ref 98–111)
Creatinine, Ser: 0.74 mg/dL (ref 0.61–1.24)
GFR, Estimated: >60 mL/min (ref >60)
Glucose, Bld: 120 mg/dL — ABNORMAL HIGH (ref 70–99)
Potassium: 3.5 mmol/L (ref 3.5–5.1)
Sodium: 137 mmol/L (ref 135–145)

## 2023-04-26 LAB — GLUCOSE, CAPILLARY
Glucose-Capillary: 124 mg/dL — ABNORMAL HIGH (ref 70–99)
Glucose-Capillary: 176 mg/dL — ABNORMAL HIGH (ref 70–99)

## 2023-04-26 LAB — MAGNESIUM: Magnesium: 1.9 mg/dL (ref 1.7–2.4)

## 2023-04-26 MED ORDER — AMOXICILLIN-POT CLAVULANATE 875-125 MG PO TABS
1.0000 | ORAL_TABLET | Freq: Two times a day (BID) | ORAL | 0 refills | Status: DC
  Filled 2023-04-26: qty 4, 2d supply, fill #0
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
## 2023-04-26 NOTE — Progress Notes (Signed)
error 
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
## 2023-04-28 ENCOUNTER — Telehealth: Payer: Self-pay | Admitting: Cardiology

## 2023-04-28 NOTE — Telephone Encounter (Signed)
 [employer] update:    Called from [employer] due to concern for high-grade AV block Underlying rhythm mobitz I, ventricular asystole for 4.4 seconds, occurred at 0318.  Suspect patient was sleeping during this time. Patch was placed on 3/27. Will continue to monitor for any additional events during daytime.   Will notify daytime cardiology to confirm if any further intervention is required.  Rubye Oaks  04/28/23 6:31 AM

## 2023-04-29 LAB — CULTURE, BLOOD (ROUTINE X 2)
Culture: NO GROWTH
Culture: NO GROWTH

## 2023-04-30 ENCOUNTER — Telehealth: Payer: Self-pay

## 2023-04-30 NOTE — Progress Notes (Signed)
 ZIO AT applied at hospital Dr. Flora Lipps to read

## 2023-04-30 NOTE — Telephone Encounter (Signed)
   Cardiac Monitor Alert  Date of alert:  04/30/2023   Patient Name: Thomas Silva  DOB: 24-Jul-1965  MRN: [id number]   [facility] Cardiologist: Reatha Harps, MD  [facility] EP:  None    Monitor Information: Long Term Monitor-Live Telemetry [ZioAT]  Reason:  Syncope Ordering provider:  Marjie Skiff   Alert 2nd degree AV Block, Type I This is the 1st alert for this rhythm.   Next Cardiology Appointment   Date:  06/27/23  Provider:  Azalee Course  The patient was contacted today.  He is asymptomatic. Arrhythmia, symptoms and history reviewed with Dr. Elberta Fortis.   Plan:  Continue to monitor   Other:   Judene Companion, LPN  06/27/4130 44:01 PM

## 2023-05-03 ENCOUNTER — Encounter: Payer: Self-pay | Admitting: Student

## 2023-05-17 NOTE — Addendum Note (Signed)
 Encounter addended by: Coral Der A on: 05/17/2023 7:04 PM  Actions taken: Imaging Exam ended

## 2023-05-20 DIAGNOSIS — R55 Syncope and collapse: Secondary | ICD-10-CM

## 2023-06-07 ENCOUNTER — Ambulatory Visit: Admitting: Physician Assistant

## 2023-06-29 ENCOUNTER — Encounter: Payer: Self-pay | Admitting: Physician Assistant

## 2023-06-29 ENCOUNTER — Ambulatory Visit: Attending: Physician Assistant | Admitting: Physician Assistant

## 2023-06-29 VITALS — BP 124/82 | HR 85 | Ht 71.0 in | Wt 238.6 lb

## 2023-06-29 DIAGNOSIS — R55 Syncope and collapse: Secondary | ICD-10-CM

## 2023-06-29 DIAGNOSIS — I455 Other specified heart block: Secondary | ICD-10-CM

## 2023-06-29 DIAGNOSIS — I10 Essential (primary) hypertension: Secondary | ICD-10-CM
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
## 2023-06-29 NOTE — Patient Instructions (Signed)
 Medication Instructions:  NO CHANGES *If you need a refill on your cardiac medications before your next appointment, please call your pharmacy*  Lab Work: NO LABS If you have labs (blood work) drawn today and your tests are completely normal, you will receive your results only by: MyChart Message (if you have MyChart) OR A paper copy in the mail If you have any lab test that is abnormal or we need to change your treatment, we will call you to review the results.  Testing/Procedures: NO TESTING  Follow-Up: At [facility], you and your health needs are our priority.  As part of our continuing mission to provide you with exceptional heart care, our providers are all part of one team.  This team includes your primary Cardiologist (physician) and Advanced Practice Providers or APPs (Physician Assistants and Nurse Practitioners) who all work together to provide you with the care you need, when you need it.  Your next appointment:   4-5 month(s)  Provider:   Oneil Bigness, MD

## 2023-07-05 ENCOUNTER — Encounter (HOSPITAL_BASED_OUTPATIENT_CLINIC_OR_DEPARTMENT_OTHER): Payer: Self-pay | Admitting: Emergency Medicine

## 2023-07-05 ENCOUNTER — Ambulatory Visit (HOSPITAL_BASED_OUTPATIENT_CLINIC_OR_DEPARTMENT_OTHER)
Admission: EM | Admit: 2023-07-05 | Discharge: 2023-07-05 | Disposition: A | Discharge status: Home / Self Care | Attending: Family Medicine | Admitting: Family Medicine

## 2023-07-05 DIAGNOSIS — H1013 Acute atopic conjunctivitis, bilateral: Secondary | ICD-10-CM

## 2023-07-05 DIAGNOSIS — J301 Allergic rhinitis due to pollen: Secondary | ICD-10-CM | POA: Diagnosis not present

## 2023-07-05 MED ORDER — FLUTICASONE PROPIONATE 50 MCG/ACT NA SUSP: 1.0000 | Freq: Two times a day (BID) | NASAL | 0 refills | Status: AC | PRN

## 2023-07-05 MED ORDER — TRIAMCINOLONE ACETONIDE 40 MG/ML IJ SUSP
40.0000 mg | Freq: Once | INTRAMUSCULAR | Status: AC
  Administered 2023-07-05: 40 mg via INTRAMUSCULAR

## 2023-07-05 MED ORDER — AZELASTINE HCL 0.05 % OP SOLN: 1.0000 [drp] | Freq: Two times a day (BID) | OPHTHALMIC | 0 refills | Status: AC

## 2023-07-05 NOTE — ED Triage Notes (Signed)
 Pt c/o red itchy eyes started on Monday, some nasal congestion.
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
## 2023-07-05 NOTE — Discharge Instructions (Addendum)
 Allergic rhinitis and conjunctivitis: Kenalog 40 mg injection now (this is a steroid injection).  Azelastine eyedrops, 1 drop into both eyes twice daily for conjunctivitis.  Fluticasone nasal spray, 1 spray into each nostril once or twice daily for nasal congestion and for irritated eyes.  Get plenty of fluids and rest.  Use OTC Zyrtec or Claritin, 10 mg, 1 pill once or twice daily as needed for allergies.  Follow-up if symptoms do not improve, worsen or new symptoms occur.

## 2024-02-15 ENCOUNTER — Ambulatory Visit (HOSPITAL_BASED_OUTPATIENT_CLINIC_OR_DEPARTMENT_OTHER)
Admission: EM | Admit: 2024-02-15 | Discharge: 2024-02-15 | Disposition: A | Discharge status: Home / Self Care | Attending: Family Medicine | Admitting: Family Medicine

## 2024-02-15 ENCOUNTER — Encounter (HOSPITAL_BASED_OUTPATIENT_CLINIC_OR_DEPARTMENT_OTHER): Payer: Self-pay

## 2024-02-15 DIAGNOSIS — J3489 Other specified disorders of nose and nasal sinuses: Secondary | ICD-10-CM | POA: Diagnosis not present

## 2024-02-15 DIAGNOSIS — J329 Chronic sinusitis, unspecified: Secondary | ICD-10-CM

## 2024-02-15 DIAGNOSIS — B9789 Other viral agents as the cause of diseases classified elsewhere: Secondary | ICD-10-CM | POA: Diagnosis not present

## 2024-02-15 MED ORDER — CETIRIZINE HCL 10 MG PO TABS: 10.0000 mg | ORAL_TABLET | Freq: Every day | ORAL | 0 refills | Status: AC | PRN

## 2024-02-15 MED ORDER — TRIAMCINOLONE ACETONIDE 40 MG/ML IJ SUSP
40.0000 mg | Freq: Once | INTRAMUSCULAR | Status: AC
  Administered 2024-02-15: 40 mg via INTRAMUSCULAR

## 2024-02-15 NOTE — ED Provider Notes (Signed)
 " Thomas Silva    CSN: [id number] Arrival date & time: 02/15/24  1657      History   Chief Complaint Chief Complaint  Patient presents with   Sinusitis   Eye Pain    HPI Thomas Silva is a 59 y.o. male.   59 year old male who was bush hogging some land on Monday, 02/11/2024.  By Tuesday he had itchy watery eyes a runny nose and congestion.  He has now developed facial pain and pressure and feels like he has a sinus infection.  He gets little to no nasal discharge and what he does get out is clear or white.  He has been using prescription azelastine  drops in his eyes.  In the past he has gotten a Kenalog  injection from his family doctor for these kind of symptoms and it has been hugely helpful.  He denies fever, body aches, cough, nausea, vomiting, constipation, diarrhea.   Sinusitis Associated symptoms: congestion, rhinorrhea and sneezing   Associated symptoms: no chest pain, no chills, no cough, no ear pain, no fever, no nausea, no sore throat and no vomiting   Eye Pain Pertinent negatives include no chest pain and no abdominal pain.    Past Medical History:  Diagnosis Date   HLD (hyperlipidemia)     Patient Active Problem List   Diagnosis Date Noted   AV block, 2nd degree 04/26/2023   Syncope and collapse 04/26/2023   Syncope 04/24/2023   DM (diabetes mellitus), secondary, with neurologic complications (HCC) 04/24/2023   Insomnia 04/24/2023   Pneumonia due to COVID-19 virus 09/05/2019   Pneumonia of right lower lobe due to infectious organism 09/05/2019   HLD (hyperlipidemia) 09/05/2019   Hypothyroidism 09/05/2019   AKI (acute kidney injury) 09/05/2019   Acute hypoxemic respiratory failure due to COVID-19 [facility]) 09/01/2019    Past Surgical History:  Procedure Laterality Date   VENTRAL HERNIA REPAIR  2017       Home Medications    Prior to Admission medications  Medication Sig Start Date End Date Taking? Authorizing Provider  aspirin  EC 81 MG  tablet Take 81 mg by mouth at bedtime.    [provider]  azelastine  (OPTIVAR ) 0.05 % ophthalmic solution Place 1 drop into both eyes 2 (two) times daily. 07/05/23   Ival Domino, FNP  cetirizine  (ZYRTEC ) 10 MG tablet Take 1 tablet (10 mg total) by mouth daily as needed for allergies. 02/15/24   Ival Domino, FNP  Cholecalciferol (VITAMIN D-3 PO) Take 1 capsule by mouth daily.    [provider]  Coenzyme Q10 (COQ10 PO) Take 1 capsule by mouth at bedtime.    [provider]  ezetimibe (ZETIA) 10 MG tablet Take 10 mg by mouth at bedtime. 03/29/23   [provider]  famotidine  (PEPCID ) 40 MG tablet Take 40 mg by mouth daily as needed. 04/07/23   [provider]  fluticasone  (FLONASE ) 50 MCG/ACT nasal spray Place 1 spray into both nostrils 2 (two) times daily as needed for rhinitis. 07/05/23 08/04/23  Ival Domino, FNP  gabapentin  (NEURONTIN ) 600 MG tablet Take 600 mg by mouth 3 (three) times daily.    [provider]  JARDIANCE 25 MG TABS tablet Take 25 mg by mouth every morning. 07/30/19   [provider]  levothyroxine  (SYNTHROID ) 75 MCG tablet Take 75 mcg by mouth daily before breakfast.    [provider]  lisinopril  (ZESTRIL ) 2.5 MG tablet Take 2.5 mg by mouth daily. 07/19/19   [provider]  metFORMIN (GLUCOPHAGE-XR) 500 MG 24 hr tablet Take 1,000 mg by mouth 2 (two) times daily. 08/27/19   [provider]  OZEMPIC, 0.25 OR 0.5 MG/DOSE, 2 MG/3ML SOPN Inject 0.25 mg into the skin every Friday. 04/07/23   [provider]  PANTOPRAZOLE  SODIUM PO Take 40 mg by mouth daily.    [provider]  POTASSIUM PO Take 1 tablet by mouth daily. OTC    [provider]  rosuvastatin  (CRESTOR ) 10 MG tablet Take 10 mg by mouth at bedtime.    [provider]  tadalafil (CIALIS) 10 MG tablet Take 10 mg by mouth daily as needed for erectile dysfunction. 04/19/23   [provider]   testosterone cypionate (DEPOTESTOSTERONE CYPIONATE) 200 MG/ML injection Inject 200 mg into the muscle once a week. 08/08/19   [provider]  valACYclovir  (VALTREX ) 1000 MG tablet Take 1,000 mg by mouth at bedtime.    [provider]  zolpidem  (AMBIEN ) 10 MG tablet Take 5 mg by mouth at bedtime. 03/23/23   [provider]    Family History History reviewed. No pertinent family history.  Social History Social History[1]   Allergies   Ultram [tramadol]   Review of Systems Review of Systems  Constitutional:  Negative for chills and fever.  HENT:  Positive for congestion, postnasal drip, rhinorrhea, sinus pressure, sinus pain and sneezing. Negative for ear pain and sore throat.   Eyes:  Positive for pain, redness and itching. Negative for visual disturbance.  Respiratory:  Negative for cough.   Cardiovascular:  Negative for chest pain and palpitations.  Gastrointestinal:  Negative for abdominal pain, constipation, diarrhea, nausea and vomiting.  Genitourinary:  Negative for dysuria and hematuria.  Musculoskeletal:  Negative for arthralgias and back pain.  Skin:  Negative for color change and rash.  Neurological:  Negative for seizures and syncope.  All other systems reviewed and are negative.    Physical Exam Triage Vital Signs ED Triage Vitals  Encounter Vitals Group     BP 02/15/24 1839 (!) 144/84     Girls Systolic BP Percentile --      Girls Diastolic BP Percentile --      Boys Systolic BP Percentile --      Boys Diastolic BP Percentile --      Pulse Rate 02/15/24 1839 85     Resp 02/15/24 1839 16     Temp 02/15/24 1839 97.7 F (36.5 C)     Temp Source 02/15/24 1839 Oral     SpO2 02/15/24 1839 92 %     Weight --      Height --      Head Circumference --      Peak Flow --      Pain Score 02/15/24 1833 10     Pain Loc --      Pain Education --      Exclude from Growth Chart --    No data found.  Updated Vital Signs BP (!) 144/84 (BP  Location: Right Arm)   Pulse 85   Temp 97.7 F (36.5 C) (Oral)   Resp 16   SpO2 92%   Visual Acuity Right Eye Distance:   Left Eye Distance:   Bilateral Distance:    Right Eye Near:   Left Eye Near:    Bilateral Near:     Physical Exam Vitals and nursing note reviewed.  Constitutional:      General: He is not in acute distress.    Appearance: He  is well-developed. He is not ill-appearing, toxic-appearing or diaphoretic.  HENT:     Head: Normocephalic and atraumatic.     Right Ear: Hearing, tympanic membrane, ear canal and external ear normal.     Left Ear: Hearing, tympanic membrane, ear canal and external ear normal.     Nose: Mucosal edema, congestion and rhinorrhea present. Rhinorrhea is clear.     Right Sinus: Maxillary sinus tenderness and frontal sinus tenderness present.     Left Sinus: Maxillary sinus tenderness and frontal sinus tenderness present.     Mouth/Throat:     Lips: Pink.     Mouth: Mucous membranes are moist.     Pharynx: Uvula midline. No oropharyngeal exudate or posterior oropharyngeal erythema.     Tonsils: No tonsillar exudate.  Eyes:     Conjunctiva/sclera: Conjunctivae normal.     Pupils: Pupils are equal, round, and reactive to light.  Cardiovascular:     Rate and Rhythm: Normal rate and regular rhythm.     Heart sounds: S1 normal and S2 normal. No murmur heard. Pulmonary:     Effort: Pulmonary effort is normal. No respiratory distress.     Breath sounds: Normal breath sounds. No decreased breath sounds, wheezing, rhonchi or rales.  Abdominal:     General: Bowel sounds are normal.     Palpations: Abdomen is soft.     Tenderness: There is no abdominal tenderness.  Musculoskeletal:        General: No swelling.     Cervical back: Neck supple.  Lymphadenopathy:     Head:     Right side of head: No submental, submandibular, tonsillar, preauricular or posterior auricular adenopathy.     Left side of head: No submental, submandibular, tonsillar,  preauricular or posterior auricular adenopathy.     Cervical: No cervical adenopathy.     Right cervical: No superficial cervical adenopathy.    Left cervical: No superficial cervical adenopathy.  Skin:    General: Skin is warm and dry.     Capillary Refill: Capillary refill takes less than 2 seconds.     Findings: No rash.  Neurological:     Mental Status: He is alert and oriented to person, place, and time.  Psychiatric:        Mood and Affect: Mood normal.      UC Treatments / Results  Labs (all labs ordered are listed, but only abnormal results are displayed) Labs Reviewed - No data to display  EKG   Radiology No results found.  Procedures Procedures (including critical Silva time)  Medications Ordered in UC Medications  triamcinolone  acetonide (KENALOG -40) injection 40 mg (40 mg Intramuscular Given 02/15/24 2008)    Initial Impression / Assessment and Plan / UC Course  I have reviewed the triage vital signs and the nursing notes.  Pertinent labs & imaging results that were available during my Silva of the patient were reviewed by me and considered in my medical decision making (see chart for details).  Plan of Silva (see discharge instructions for additional patient precautions and education): Viral sinusitis versus allergenic sinusitis and sinus pressure: Kenalog  40 mg injection now.  Encouraged cetirizine  10 mg, 1 pill twice daily if needed for allergies.  Get plenty of fluids and rest.  Encouraged to consider sinus rinses twice daily.  Follow-up with primary Silva or return here if symptoms do not improve, worsen or new symptoms occur.  I reviewed the plan of Silva with the patient and/or the patient's guardian.  The patient  and/or guardian had time to ask questions and acknowledged that the questions were answered.  Final Clinical Impressions(s) / UC Diagnoses   Final diagnoses:  Sinus pressure  Viral sinusitis     Discharge Instructions      Viral  sinusitis versus allergenic sinusitis and sinus pressure: Kenalog  40 mg injection now.  Encouraged cetirizine  10 mg, 1 pill twice daily if needed for allergies.  Get plenty of fluids and rest.  Encouraged to consider sinus rinses twice daily.  Follow-up with primary Silva or return here if symptoms do not improve, worsen or new symptoms occur.    ED Prescriptions     Medication Sig Dispense Auth. Provider   cetirizine  (ZYRTEC ) 10 MG tablet Take 1 tablet (10 mg total) by mouth daily as needed for allergies. 30 tablet Zymiere Trostle, FNP      PDMP not reviewed this encounter.     [1]  Social History Tobacco Use   Smoking status: Never   Smokeless tobacco: Never  Vaping Use   Vaping status: Never Used  Substance Use Topics   Alcohol use: No   Drug use: No     Ival Domino, FNP 02/15/24 2128  "

## 2024-02-15 NOTE — Discharge Instructions (Addendum)
 Viral sinusitis versus allergenic sinusitis and sinus pressure: Kenalog  40 mg injection now.  Encouraged cetirizine  10 mg, 1 pill twice daily if needed for allergies.  Get plenty of fluids and rest.  Encouraged to consider sinus rinses twice daily.  Follow-up with primary care or return here if symptoms do not improve, worsen or new symptoms occur.

## 2024-02-15 NOTE — ED Triage Notes (Addendum)
 Sinus infection Eye pain, allergy  Mowed on Monday and believes it caused him to feel bad  Using Azelastine  eye drops
